# Patient Record
Sex: Male | Born: 1950
Health system: Southern US, Community
[De-identification: ages and names within clinical notes are randomized; demographics above are authoritative.]

## PROBLEM LIST (undated history)

## (undated) DIAGNOSIS — I10 Essential (primary) hypertension: Secondary | ICD-10-CM

## (undated) DIAGNOSIS — H269 Unspecified cataract: Secondary | ICD-10-CM

## (undated) DIAGNOSIS — T7840XA Allergy, unspecified, initial encounter: Secondary | ICD-10-CM

## (undated) HISTORY — DX: Allergy, unspecified, initial encounter: T78.40XA

## (undated) HISTORY — DX: Unspecified cataract: H26.9

## (undated) HISTORY — DX: Essential (primary) hypertension: I10

---

## 2003-04-02 ENCOUNTER — Encounter: Admission: RE | Admit: 2003-04-02 | Discharge: 2003-04-02 | Payer: Self-pay | Admitting: Family Medicine

## 2003-05-07 ENCOUNTER — Encounter: Admission: RE | Admit: 2003-05-07 | Discharge: 2003-05-07 | Payer: Self-pay | Admitting: Family Medicine

## 2018-12-01 ENCOUNTER — Other Ambulatory Visit: Payer: Self-pay

## 2018-12-01 ENCOUNTER — Telehealth (INDEPENDENT_AMBULATORY_CARE_PROVIDER_SITE_OTHER): Payer: 59 | Admitting: Emergency Medicine

## 2018-12-01 ENCOUNTER — Encounter: Payer: Self-pay | Admitting: Emergency Medicine

## 2018-12-01 DIAGNOSIS — I10 Essential (primary) hypertension: Secondary | ICD-10-CM

## 2018-12-01 DIAGNOSIS — T464X5A Adverse effect of angiotensin-converting-enzyme inhibitors, initial encounter: Secondary | ICD-10-CM

## 2018-12-01 DIAGNOSIS — R058 Other specified cough: Secondary | ICD-10-CM | POA: Insufficient documentation

## 2018-12-01 DIAGNOSIS — R05 Cough: Secondary | ICD-10-CM | POA: Insufficient documentation

## 2018-12-01 MED ORDER — AMLODIPINE BESYLATE 5 MG PO TABS: 5.0000 mg | ORAL_TABLET | Freq: Every day | ORAL | 3 refills | Status: DC

## 2018-12-01 NOTE — Progress Notes (Signed)
Telemedicine Encounter- SOAP NOTE Established Patient  This telephone encounter was conducted with the patient's (or proxy's) verbal consent via audio telecommunications: yes/no: Yes Patient was instructed to have this encounter in a suitably private space; and to only have persons present to whom they give permission to participate. In addition, patient identity was confirmed by use of name plus two identifiers (DOB and address).  I discussed the limitations, risks, security and privacy concerns of performing an evaluation and management service by telephone and the availability of in person appointments. I also discussed with the patient that there may be a patient responsible charge related to this service. The patient expressed understanding and agreed to proceed.  I spent a total of TIME; 0 MIN TO 60 MIN: 20 minutes talking with the patient or their proxy.  No chief complaint on file. Establish care  Subjective   Bryan Burnett is a 68 y.o. male patient. Telephone visit today to establish care.  First visit to this office. Mr. Bryan Burnett has a history of hypertension and was started on lisinopril 20 mg about a month ago.  Since he has developed a dry cough with intermittent headaches.  No other significant symptoms.  No other chronic medical problems. Non-smoker.  No history of COPD.  No significant family history.  Denies flulike symptoms, fever, chills, difficulty breathing, myalgias, diarrhea, congestion, or chest pain.  HPI   Patient Active Problem List   Diagnosis Date Noted  . Essential hypertension 12/01/2018  . Cough due to ACE inhibitor 12/01/2018    History reviewed. No pertinent past medical history.  Current Outpatient Medications  Medication Sig Dispense Refill  . amLODipine (NORVASC) 5 MG tablet Take 1 tablet (5 mg total) by mouth daily. 90 tablet 3   No current facility-administered medications for this visit.     No Known Allergies  Social History    Socioeconomic History  . Marital status: Married    Spouse name: Not on file  . Number of children: Not on file  . Years of education: Not on file  . Highest education level: Not on file  Occupational History  . Not on file  Social Needs  . Financial resource strain: Not on file  . Food insecurity:    Worry: Not on file    Inability: Not on file  . Transportation needs:    Medical: Not on file    Non-medical: Not on file  Tobacco Use  . Smoking status: Not on file  Substance and Sexual Activity  . Alcohol use: Not on file  . Drug use: Not on file  . Sexual activity: Not on file  Lifestyle  . Physical activity:    Days per week: Not on file    Minutes per session: Not on file  . Stress: Not on file  Relationships  . Social connections:    Talks on phone: Not on file    Gets together: Not on file    Attends religious service: Not on file    Active member of club or organization: Not on file    Attends meetings of clubs or organizations: Not on file    Relationship status: Not on file  . Intimate partner violence:    Fear of current or ex partner: Not on file    Emotionally abused: Not on file    Physically abused: Not on file    Forced sexual activity: Not on file  Other Topics Concern  . Not on file  Social  History Narrative  . Not on file    Review of Systems  Constitutional: Negative.  Negative for chills, fever and weight loss.  HENT: Negative.  Negative for hearing loss, nosebleeds and sore throat.   Eyes: Negative.  Negative for blurred vision and double vision.  Respiratory: Negative.  Negative for cough and shortness of breath.   Cardiovascular: Negative.  Negative for chest pain, palpitations and leg swelling.  Gastrointestinal: Negative for abdominal pain, diarrhea, nausea and vomiting.  Genitourinary: Negative for dysuria and hematuria.  Musculoskeletal: Negative for back pain, joint pain and myalgias.  Skin: Negative.  Negative for rash.   Neurological: Negative.  Negative for dizziness, sensory change, speech change, focal weakness, loss of consciousness and headaches.  Endo/Heme/Allergies: Negative.   All other systems reviewed and are negative.   Objective   Vitals as reported by the patient: None available Awake and oriented x3 in no apparent respiratory distress. There were no vitals filed for this visit.  Diagnoses and all orders for this visit:  Essential hypertension  Cough due to ACE inhibitor  Other orders -     amLODipine (NORVASC) 5 MG tablet; Take 1 tablet (5 mg total) by mouth daily.  Cough most likely secondary to ACE inhibitor.  Will stop lisinopril and start amlodipine 5 mg a day. Treatment of hypertension discussed with patient as well as cardiovascular risk associated with it. Diet and nutrition discussed with patient. Need for office visit and blood work.  We will schedule for next week.   I discussed the assessment and treatment plan with the patient. The patient was provided an opportunity to ask questions and all were answered. The patient agreed with the plan and demonstrated an understanding of the instructions.   The patient was advised to call back or seek an in-person evaluation if the symptoms worsen or if the condition fails to improve as anticipated.  I provided 20 minutes of non-face-to-face time during this encounter.  Georgina QuintMiguel Jose Brenn Deziel, MD  [facility]

## 2018-12-01 NOTE — Progress Notes (Signed)
Contacted patient to triage for appointment using Interpreter [id number]). Patient will establish care and complaining of his blood pressure went up 1 month ago. Patient states he developed a headache and cough he is concerned. Patient states he went to the doctor and he gave him Lisinopril 20 mg to take. Patient states he took his blood pressure yesterday it was 130/82.

## 2018-12-08 ENCOUNTER — Ambulatory Visit: Payer: 59 | Admitting: Emergency Medicine

## 2018-12-08 ENCOUNTER — Encounter: Payer: Self-pay | Admitting: Emergency Medicine

## 2018-12-08 ENCOUNTER — Other Ambulatory Visit: Payer: Self-pay

## 2018-12-08 VITALS — BP 128/80 | HR 97 | Temp 99.1°F | Resp 18 | Ht 64.96 in | Wt 143.4 lb

## 2018-12-08 DIAGNOSIS — Z125 Encounter for screening for malignant neoplasm of prostate: Secondary | ICD-10-CM | POA: Diagnosis not present

## 2018-12-08 DIAGNOSIS — Z1329 Encounter for screening for other suspected endocrine disorder: Secondary | ICD-10-CM | POA: Diagnosis not present

## 2018-12-08 DIAGNOSIS — I10 Essential (primary) hypertension: Secondary | ICD-10-CM

## 2018-12-08 DIAGNOSIS — Z13220 Encounter for screening for lipoid disorders: Secondary | ICD-10-CM

## 2018-12-08 DIAGNOSIS — Z130 Encounter for screening for diseases of the blood and blood-forming organs and certain disorders involving the immune mechanism: Secondary | ICD-10-CM | POA: Diagnosis not present

## 2018-12-08 DIAGNOSIS — Z23 Encounter for immunization: Secondary | ICD-10-CM | POA: Diagnosis not present

## 2018-12-08 DIAGNOSIS — Z1211 Encounter for screening for malignant neoplasm of colon: Secondary | ICD-10-CM

## 2018-12-08 DIAGNOSIS — Z13228 Encounter for screening for other metabolic disorders: Secondary | ICD-10-CM

## 2018-12-08 DIAGNOSIS — Z1321 Encounter for screening for nutritional disorder: Secondary | ICD-10-CM | POA: Diagnosis not present

## 2018-12-08 NOTE — Progress Notes (Signed)
Bryan Burnett 67 y.o.   Chief Complaint  Patient presents with  . Hypertension    f/u    HISTORY OF PRESENT ILLNESS:  This is a 68 y.o. male here to establish care with me and follow-up of hypertension.  Telemedicine visit 1 week ago.  Switch from lisinopril to amlodipine due to cough side effect.  Blood pressure readings at home as follows: 113/75, 114/70, 129/82, 142/84.  Doing better. Non-smoker and no EtOH abuser.  Physically active job.  No symptoms of angina or dyspnea on exertion.  No recent blood work to review. Good nutrition.  Good weight.  Sleeps well.  Healthy lifestyle.  HPI   Prior to Admission medications   Medication Sig Start Date End Date Taking? Authorizing Provider  amLODipine (NORVASC) 5 MG tablet Take 1 tablet (5 mg total) by mouth daily. 12/01/18  Yes SagardiaEilleen Kempf, MD    No Known Allergies  Patient Active Problem List   Diagnosis Date Noted  . Essential hypertension 12/01/2018  . Cough due to ACE inhibitor 12/01/2018    History reviewed. No pertinent past medical history.  History reviewed. No pertinent surgical history.  Social History   Socioeconomic History  . Marital status: Legally Separated    Spouse name: Not on file  . Number of children: 7  . Years of education: Not on file  . Highest education level: Not on file  Occupational History  . Not on file  Social Needs  . Financial resource strain: Not on file  . Food insecurity:    Worry: Not on file    Inability: Not on file  . Transportation needs:    Medical: Not on file    Non-medical: Not on file  Tobacco Use  . Smoking status: Never Smoker  . Smokeless tobacco: Never Used  Substance and Sexual Activity  . Alcohol use: Never    Frequency: Never  . Drug use: Never  . Sexual activity: Not Currently  Lifestyle  . Physical activity:    Days per week: Not on file    Minutes per session: Not on file  . Stress: Not on file  Relationships  . Social connections:     Talks on phone: Not on file    Gets together: Not on file    Attends religious service: Not on file    Active member of club or organization: Not on file    Attends meetings of clubs or organizations: Not on file    Relationship status: Not on file  . Intimate partner violence:    Fear of current or ex partner: Not on file    Emotionally abused: Not on file    Physically abused: Not on file    Forced sexual activity: Not on file  Other Topics Concern  . Not on file  Social History Narrative  . Not on file    History reviewed. No pertinent family history.   Review of Systems  Constitutional: Negative.  Negative for chills and fever.  HENT: Negative.  Negative for congestion, nosebleeds and sore throat.   Eyes: Negative.   Respiratory: Negative.  Negative for cough and shortness of breath.   Cardiovascular: Negative.  Negative for chest pain and palpitations.  Gastrointestinal: Negative.  Negative for abdominal pain, diarrhea, nausea and vomiting.  Genitourinary: Negative.  Negative for dysuria.  Musculoskeletal: Negative.  Negative for myalgias.  Skin: Negative.  Negative for rash.  Neurological: Positive for dizziness and headaches.  All other systems reviewed and are  negative.   Vitals:   12/08/18 1003  BP: 128/80  Pulse: 97  Resp: 18  Temp: 99.1 F (37.3 C)  SpO2: 97%    Physical Exam Vitals signs reviewed.  Constitutional:      Appearance: Normal appearance.  HENT:     Head: Normocephalic and atraumatic.     Nose: Nose normal.     Mouth/Throat:     Mouth: Mucous membranes are moist.     Pharynx: Oropharynx is clear.  Eyes:     Extraocular Movements: Extraocular movements intact.     Conjunctiva/sclera: Conjunctivae normal.     Pupils: Pupils are equal, round, and reactive to light.  Neck:     Musculoskeletal: Normal range of motion and neck supple.  Cardiovascular:     Rate and Rhythm: Normal rate and regular rhythm.     Pulses: Normal pulses.      Heart sounds: Normal heart sounds.  Pulmonary:     Effort: Pulmonary effort is normal.     Breath sounds: Normal breath sounds.  Abdominal:     General: There is no distension.     Palpations: Abdomen is soft. There is no mass.     Tenderness: There is no abdominal tenderness.  Musculoskeletal: Normal range of motion.  Skin:    General: Skin is warm and dry.     Capillary Refill: Capillary refill takes less than 2 seconds.  Neurological:     General: No focal deficit present.     Mental Status: He is alert and oriented to person, place, and time.     Sensory: No sensory deficit.     Motor: No weakness.     Coordination: Coordination normal.     Gait: Gait normal.  Psychiatric:        Mood and Affect: Mood normal.        Behavior: Behavior normal.    A total of 25 minutes was spent in the room with the patient, greater than 50% of which was in counseling/coordination of care regarding hypertension and associated cardiovascular risks, prognosis, diet and nutrition in particular DASHdiet, importance of exercise, need for blood work, medication and side effects, COVID-19 precautions, importance of vaccinations in particular to prevent pneumonia, importance of colonoscopy to screen for colon cancer, and need for follow-up in 3 months.   ASSESSMENT & PLAN: Bryan Burnett was seen today for hypertension.  Diagnoses and all orders for this visit:  Essential hypertension -     CBC with Differential/Platelet -     Comprehensive metabolic panel -     Lipid panel -     Hemoglobin A1c  Prostate cancer screening -     PSA  Screening for endocrine, nutritional, metabolic and immunity disorder -     CBC with Differential/Platelet -     Comprehensive metabolic panel -     Hemoglobin A1c  Screening for deficiency anemia -     CBC with Differential/Platelet  Screening for lipoid disorders -     Lipid panel  Colon cancer screening -     Ambulatory referral to Gastroenterology  Need for  Tdap vaccination -     Tdap vaccine greater than or equal to 7yo IM  Need for pneumococcal vaccination -     Pneumococcal polysaccharide vaccine 23-valent greater than or equal to 2yo subcutaneous/IM    Patient Instructions       If you have lab work done today you will be contacted with your lab results within the next 2  weeks.  If you have not heard from [place] then please contact us. The fastest way to get your results is to register for My Chart.   IF you received an x-ray today, you will receive an invoice from [facility]. Please contact [facility] at [phone] with questions or concerns regarding your invoice.   IF you received labwork today, you will receive an invoice from [facility]. Please contact [facility] at [phone] with questions or concerns regarding your invoice.   Our billing staff will not be able to assist you with questions regarding bills from these companies.  You will be contacted with the lab results as soon as they are available. The fastest way to get your results is to activate your My Chart account. Instructions are located on the last page of this paperwork. If you have not heard from [place] regarding the results in 2 weeks, please contact this office.     Mantenimiento de la salud despus de los 65 aos de edad Health Maintenance After Age 30 Despus de los 65 aos de edad, corre un riesgo mayor de Film/video editor enfermedades e infecciones a Air cabin crew, como tambin de sufrir lesiones por cadas. Las cadas son la causa principal de las fracturas de huesos y lesiones en la cabeza de personas mayores de 65 aos de edad. Recibir cuidados preventivos de forma regular puede ayudarlo a mantenerse saludable y en buen [place]. Los cuidados preventivos incluyen realizarse anlisis de forma regular y Forensic psychologist en el estilo de vida segn las recomendaciones del mdico. Converse con el profesional que lo asiste sobre:  Las pruebas de deteccin  y los anlisis que debe International aid/development worker. Una prueba de deteccin es un estudio que se para Engineer, manufacturing la presencia de una enfermedad cuando no tiene sntomas.  Un plan de dieta y ejercicios adecuado para usted. Qu debo saber sobre las pruebas de deteccin y los anlisis para prevenir cadas? Realizarse pruebas de deteccin y [employer] es la mejor manera de Engineer, manufacturing un problema de salud de forma temprana. El diagnstico y tratamiento tempranos le brindan la mejor oportunidad de Chief Operating Officer las afecciones mdicas que son comunes despus de los 65 aos de edad. Ciertas afecciones y elecciones de estilo de vida pueden hacer que sea ms propenso a sufrir Engineer, site. El mdico puede recomendarle lo siguiente:  Controles regulares de la visin. Una visin deficiente y afecciones como las cataratas pueden hacer que sea ms propenso a sufrir Engineer, site. Si [place] lentes, asegrese de obtener una receta actualizada si su visin cambia.  Revisin de medicamentos. Revise regularmente con el mdico todos los medicamentos que toma, incluidos los medicamentos de [place]. Consulte al [employer] Products efectos secundarios que pueden hacer que sea ms propenso a sufrir Engineer, site. Informe al mdico si alguno de los medicamentos que toma lo hace sentir mareado o somnoliento.  Pruebas de deteccin para la osteoporosis. La osteoporosis es una afeccin que hace que los huesos se vuelvan ms frgiles. En consecuencia, los huesos pueden debilitarse y quebrarse ms fcilmente.  Pruebas de deteccin para la presin arterial. Los cambios en la presin arterial y los medicamentos para Chief Operating Officer la presin arterial pueden hacerlo sentir mareado.  Controles de fuerza y equilibrio. El mdico puede recomendar ciertos estudios para controlar su fuerza y equilibrio al estar de pie, al caminar o al cambiar de posicin.  Examen de los pies. El dolor y Materials engineer en los pies, como tambin no utilizar el calzado [place], pueden hacer que  sea ms propenso  a sufrir Engineer, site.  Prueba de deteccin de la depresin. Es ms probable que sufra una cada si tiene miedo a caerse, se siente mal emocionalmente o se siente incapaz de Probation officer.  Prueba de deteccin de consumo de alcohol. Beber demasiado alcohol puede afectar su equilibrio y puede hacer que sea ms propenso a sufrir Engineer, site. Qu medidas puedo tomar para reducir mi riesgo de sufrir una cada? Instrucciones generales  Hable con el mdico sobre sus riesgos de sufrir una cada. Infrmele a su mdico si: ? Se cae. Asegrese de informarle a su mdico acerca de todas las cadas, incluso aquellas que parecen ser [employer]. ? Se siente mareado, somnoliento o que pierde el equilibrio.  Tome los medicamentos de venta libre y los recetados solamente como se lo haya indicado el mdico. Estos incluyen todos los suplementos.  Siga una dieta sana y [place] un peso saludable. Una dieta saludable incluye productos lcteos descremados, carnes bajas en contenido de grasa ([place], [place] de granos enteros, frijoles y [place] frutas y verduras. La seguridad en el hogar  Retire los objetos que puedan causar tropiezos tales como alfombras, cables u obstculos.  Instale equipos de seguridad, como barras para sostn en los baos y barandas de seguridad en las escaleras.  Mantenga las habitaciones y los pasillos bien iluminados. Actividad   Siga un programa de ejercicio regular para mantenerse en forma. Esto lo ayudar a Radio producer equilibrio. Consulte al mdico qu tipos de ejercicios son adecuados para usted.  Si necesita un bastn o un andador, selo segn las recomendaciones del mdico.  Utilice calzado con buen apoyo y suela antideslizante. Estilo de vida  No beba alcohol si el mdico le indica que no beba.  Si bebe alcohol, limite la cantidad que consume: ? De 0 a 1 medida por da para las mujeres. ? De 0 a 2 medidas por da para los hombres.  Est  atento a la cantidad de alcohol que contiene su bebida. En los [place] una medida equivale a una botella tpica de cerveza (12 onzas), media copa de vino (5 onzas) o una medida de bebida blanca (1 onza).  No consuma ningn producto que contenga nicotina o tabaco, como cigarrillos y Administrator, Civil Service. Si necesita ayuda para dejar de fumar, consulte al mdico. Resumen  Tener un estilo de vida saludable y recibir cuidados preventivos pueden ayudar a Research scientist (physical sciences) salud y el bienestar despus de los 65 aos de [place].  Realizarse pruebas de deteccin y [employer] es la mejor manera de Engineer, manufacturing un problema de salud de forma temprana y [place] a Automotive engineer una cada. El diagnstico y tratamiento tempranos le brindan la mejor oportunidad de Chief Operating Officer las afecciones mdicas ms comunes en las personas mayores de 65 aos de edad.  Las cadas son la causa principal de las fracturas de huesos y lesiones en la cabeza de personas mayores de 65 aos de edad. Tome precauciones para evitar una cada en su casa.  Trabaje con el mdico para saber qu cambios que puede hacer para mejorar su salud y [place], y para prevenir las cadas. Esta informacin no tiene Theme park manager el consejo del mdico. Asegrese de hacerle al mdico cualquier pregunta que tenga. Document Released: 08/19/2017 Document Revised: 08/19/2017 Document Reviewed: 08/19/2017 Elsevier Interactive Patient Education  2019 Elsevier Inc.  Hipertensin Hypertension Introduccin La hipertensin, conocida comnmente como presin arterial alta, se produce cuando la sangre bombea en las arterias con mucha fuerza. Las arterias son los vasos sanguneos que  transportan la sangre desde el corazn al resto del cuerpo. La hipertensin hace que el corazn haga ms esfuerzo para Insurance account manager y [employer] Corporation que las arterias se Armed forces training and education officer o Multimedia programmer. La hipertensin no tratada o no controlada puede causar infarto de miocardio, accidentes  cerebrovasculares, enfermedad renal y otros problemas. Una lectura de la presin arterial consiste de un nmero ms alto sobre un nmero ms bajo. En condiciones ideales, la presin arterial debe estar por debajo de 120/80. El primer nmero ("superior") es la presin sistlica. Es la medida de la presin de las arterias cuando el corazn late. El segundo nmero ("inferior") es la presin diastlica. Es la medida de la presin en las arterias cuando el corazn se relaja. Cules son las causas? Se desconoce la causa de esta afeccin. Qu incrementa el riesgo? Algunos factores de riesgo de hipertensin estn bajo su control. Otros no. Factores que puede Om[employer]om.  Tener diabetes mellitus tipo 2, colesterol alto, o ambos.  No hacer la cantidad suficiente de actividad fsica o ejercicio.  Tener sobrepeso.  Consumir mucha grasa, azcar, caloras o sal (sodio) en su dieta.  Beber alcohol en exceso. Factores que son difciles o imposibles de modificar  Tener enfermedad renal crnica.  Tener antecedentes familiares de presin arterial alta.  La edad. Los riesgos aumentan con la edad.  La raza. El riesgo es mayor para las Statistician.  El sexo. Antes de los 45aos, los hombres corren ms [employer]. Despus de los 65aos, las mujeres corren ms [employer].  Tener apnea obstructiva del sueo.  El estrs. Cules son los signos o los sntomas? La presin arterial extremadamente alta (crisis hipertensiva) puede provocar:  Dolor de [place].  Ansiedad.  Falta de aire.  Hemorragia nasal.  Nuseas y vmitos.  Dolor de pecho intenso.  Una crisis de movimientos que no puede controlar (convulsiones). Cmo se diagnostica? Esta afeccin se diagnostica midiendo su presin arterial mientras se encuentra sentado, con el brazo apoyado sobre una superficie. El brazalete del tensimetro debe colocarse directamente sobre la piel de la parte  superior del brazo y al nivel de su corazn. Debe medirla al [facility] veces en el mismo brazo. Determinadas condiciones pueden causar una diferencia de presin arterial entre el brazo izquierdo y Aeronautical engineer. Ciertos factores pueden provocar que las lecturas de la presin arterial sean inferiores o superiores a lo normal (elevadas) por un perodo corto de tiempo:  Si su presin arterial es ms alta cuando se encuentra en el consultorio del mdico que cuando la mide en su hogar, se denomina "hipertensin de bata blanca". La Harley[employer]Davidson de las personas que tienen esta afeccin no deben ser [employer].  Si su presin arterial es ms alta en el hogar que cuando se encuentra en el consultorio del mdico, se denomina "hipertensin enmascarada". La Harley[employer]Davidson de las personas que tienen esta afeccin deben ser medicadas para Chief Operating Officer la presin arterial. Si tiene una lecturas de presin arterial alta durante una visita o si tiene presin arterial normal con otros factores de riesgo:  Es posible que se le pida que regrese Banker para volver a Chief Operating Officer su presin arterial.  Se le puede pedir que se controle la presin arterial en su casa durante 1 semana o ms. Si se le diagnostica hipertensin, es posible que se le realicen otros anlisis de sangre o estudios de diagnstico por imgenes para ayudar a su mdico a comprender su riesgo general de tener otras afecciones. Cmo se trata? Esta  afeccin se trata haciendo cambios saludables en el estilo de vida, tales como ingerir alimentos saludables, realizar ms ejercicio y reducir el consumo de alcohol. El mdico puede recetarle medicamentos si los cambios en el estilo de vida no son suficientes para Museum/gallery curatorlograr controlar la presin arterial y si:  Su presin arterial sistlica est por encima de 130.  Su presin arterial diastlica est por encima de 80. La presin arterial deseada puede variar en funcin de las enfermedades, la edad y otros factores personales. Siga  estas instrucciones en su casa: Comida y bebida   Siga una dieta con alto contenido de fibras y Chalfantpotasio, y con bajo contenido de sodio, International aid/development workerazcar agregada y Neurosurgeongrasas. Un ejemplo de plan alimenticio es la dieta DASH (Dietary Approaches to Stop Hypertension, Mtodos alimenticios para detener la hipertensin). Para alimentarse de esta manera: ? Coma mucha fruta y verdura fresca. Trate de que la mitad del plato de cada comida sea de frutas y verduras. ? Coma cereales integrales, como pasta integral, arroz integral y pan integral. Llene aproximadamente un cuarto del plato con cereales integrales. ? Coma y beba productos lcteos con bajo contenido de grasa, como leche descremada o yogur bajo en grasas. ? Evite la ingesta de cortes de carne grasa, carne procesada o curada, y carne de ave con piel. Llene aproximadamente un cuarto del plato con protenas magras, como pescado, pollo sin piel, frijoles, huevos y tofu. ? Evite ingerir alimentos prehechos o procesados. En general, estos tienen mayor cantidad de sodio, azcar agregada y Steffanie Rainwatergrasa.  Reduzca su ingesta diaria de sodio. La mayora de las personas que tienen hipertensin deben comer menos de 1500 mg de sodio por C.H. Robinson Worldwideda.  Limite el consumo de alcohol a no ms de 1 medida por da si es mujer y no est Orthoptistembarazada y a 2 medidas por da si es hombre. Una medida equivale a 12onzas de cerveza, 5onzas de vino o 1onzas de bebidas alcohlicas de alta graduacin. Estilo de vida  Trabaje con su mdico para mantener un peso saludable o Curatorperder peso. Pregntele cual es su peso recomendado.  Realice al menos 30 minutos de ejercicio que haga que se acelere su corazn (ejercicio Magazine features editoraerbico) la DIRECTVmayora de los das de la Penney Farmssemana. Estas actividades pueden incluir caminar, nadar o andar en bicicleta.  Incluya ejercicios para fortalecer sus msculos (ejercicios de resistencia), como pilates o levantamiento de pesas, como parte de su rutina semanal de ejercicios. Intente realizar  30minutos de este tipo de ejercicios al Kelloggmenos tres das a la [place]semana.  No consuma ningn producto que contenga nicotina o tabaco, como cigarrillos y Administrator, Civil Servicecigarrillos electrnicos. Si necesita ayuda para dejar de fumar, consulte al mdico.  Contrlese la presin arterial en su casa segn las indicaciones del mdico.  Concurra a todas las visitas de control como se lo haya indicado el mdico. Esto es importante. Medicamentos  Baxter Internationalome los medicamentos de venta libre y los recetados solamente como se lo haya indicado el mdico. Siga cuidadosamente las indicaciones. Los medicamentos para la presin arterial deben tomarse segn las indicaciones.  No omita las dosis de medicamentos para la presin arterial. Si lo hace, estar en riesgo de tener problemas y puede hacer que los medicamentos sean menos eficaces.  Pregntele a su mdico a qu efectos secundarios o reacciones a los Museum/gallery curatormedicamentos debe prestar atencin. Comunquese con un mdico si:  Piensa que tiene una reaccin a un medicamento que est tomando.  Tiene dolores de cabeza frecuentes (recurrentes).  Siente mareos.  Tiene hinchazn en los tobillos.  Tiene problemas de visin. Solicite ayuda de inmediato si:  Siente un dolor de cabeza intenso o confusin.  Siente debilidad inusual o adormecimiento.  Siente que va a desmayarse.  Siente un dolor intenso en el pecho o el abdomen.  Vomita repetidas veces.  Tiene dificultad para respirar. Resumen  La hipertensin se produce cuando la sangre bombea en las arterias con mucha fuerza. Si esta afeccin no se controla, podra correr riesgo de tener complicaciones graves.  La presin arterial deseada puede variar en funcin de las enfermedades, la edad y otros factores personales. Para la [employer], una presin arterial normal es menor que 120/80.  La hipertensin se trata con cambios en el estilo de vida, medicamentos o una combinacin de [place]. Los [employer] Corporation estilo de vida  incluyen prdida de peso, ingerir alimentos sanos, seguir una dieta baja en sodio, hacer ms ejercicio y Glass blower/designer consumo de alcohol. Esta informacin no tiene Theme park manager el consejo del mdico. Asegrese de hacerle al mdico cualquier pregunta que tenga. Document Released: 07/06/2005 Document Revised: 06/17/2016 Document Reviewed: 06/17/2016 Elsevier Interactive Patient Education  2019 Elsevier Inc.      Edwina Barth, MD [facility]

## 2018-12-08 NOTE — Patient Instructions (Addendum)
   If you have lab work done today you will be contacted with your lab results within the next 2 weeks.  If you have not heard from us then please contact us. The fastest way to get your results is to register for My Chart.   IF you received an x-ray today, you will receive an invoice from [facility]. Please contact [facility] at [phone] with questions or concerns regarding your invoice.   IF you received labwork today, you will receive an invoice from [facility]. Please contact [facility] at [phone] with questions or concerns regarding your invoice.   Our billing staff will not be able to assist you with questions regarding bills from these companies.  You will be contacted with the lab results as soon as they are available. The fastest way to get your results is to activate your My Chart account. Instructions are located on the last page of this paperwork. If you have not heard from us regarding the results in 2 weeks, please contact this office.     Mantenimiento de la salud despus de los 65 aos de edad Health Maintenance After Age 65 Despus de los 65 aos de edad, corre un riesgo mayor de padecer ciertas enfermedades e infecciones a largo plazo, como tambin de sufrir lesiones por cadas. Las cadas son la causa principal de las fracturas de huesos y lesiones en la cabeza de personas mayores de 65 aos de edad. Recibir cuidados preventivos de forma regular puede ayudarlo a mantenerse saludable y en buen estado. Los cuidados preventivos incluyen realizarse anlisis de forma regular y realizar cambios en el estilo de vida segn las recomendaciones del mdico. Converse con el profesional que lo asiste sobre:  Las pruebas de deteccin y los anlisis que debe realizarse. Una prueba de deteccin es un estudio que se para detectar la presencia de una enfermedad cuando no tiene sntomas.  Un plan de dieta y ejercicios adecuado para usted. Qu debo saber sobre las  pruebas de deteccin y los anlisis para prevenir cadas? Realizarse pruebas de deteccin y anlisis es la mejor manera de detectar un problema de salud de forma temprana. El diagnstico y tratamiento tempranos le brindan la mejor oportunidad de controlar las afecciones mdicas que son comunes despus de los 65 aos de edad. Ciertas afecciones y elecciones de estilo de vida pueden hacer que sea ms propenso a sufrir una cada. El mdico puede recomendarle lo siguiente:  Controles regulares de la visin. Una visin deficiente y afecciones como las cataratas pueden hacer que sea ms propenso a sufrir una cada. Si usa lentes, asegrese de obtener una receta actualizada si su visin cambia.  Revisin de medicamentos. Revise regularmente con el mdico todos los medicamentos que toma, incluidos los medicamentos de venta libre. Consulte al mdico sobre los efectos secundarios que pueden hacer que sea ms propenso a sufrir una cada. Informe al mdico si alguno de los medicamentos que toma lo hace sentir mareado o somnoliento.  Pruebas de deteccin para la osteoporosis. La osteoporosis es una afeccin que hace que los huesos se vuelvan ms frgiles. En consecuencia, los huesos pueden debilitarse y quebrarse ms fcilmente.  Pruebas de deteccin para la presin arterial. Los cambios en la presin arterial y los medicamentos para controlar la presin arterial pueden hacerlo sentir mareado.  Controles de fuerza y equilibrio. El mdico puede recomendar ciertos estudios para controlar su fuerza y equilibrio al estar de pie, al caminar o al cambiar de posicin.  Examen de los pies.   El dolor y Materials engineerel adormecimiento en los pies, como tambin no utilizar el calzado Interlakenadecuado, pueden hacer que sea ms propenso a sufrir Engineer, siteuna cada.  Prueba de deteccin de la depresin. Es ms probable que sufra una cada si tiene miedo a caerse, se siente mal emocionalmente o se siente incapaz de Designer, fashion/clothingrealizar actividades que sola  hacer.  Prueba de deteccin de consumo de alcohol. Beber demasiado alcohol puede afectar su equilibrio y puede hacer que sea ms propenso a sufrir Engineer, siteuna cada. Qu medidas puedo tomar para reducir mi riesgo de sufrir una cada? Instrucciones generales  Hable con el mdico sobre sus riesgos de sufrir una cada. Infrmele a su mdico si: ? Se cae. Asegrese de informarle a su mdico acerca de todas las cadas, incluso aquellas que parecen ser Liberty Globalmenores. ? Se siente mareado, somnoliento o que pierde el equilibrio.  Tome los medicamentos de venta libre y los recetados solamente como se lo haya indicado el mdico. Estos incluyen todos los suplementos.  Siga una dieta sana y Jacksonmantenga un peso saludable. Una dieta saludable incluye productos lcteos descremados, carnes bajas en contenido de grasa (Des Arcmagras, Elmirafibra de granos enteros, frijoles y [place] Aetnamuchas frutas y verduras. La seguridad en el hogar  Retire los objetos que puedan causar tropiezos tales como alfombras, cables u obstculos.  Instale equipos de seguridad, como barras para sostn en los baos y barandas de seguridad en las escaleras.  Mantenga las habitaciones y los pasillos bien iluminados. Actividad   Siga un programa de ejercicio regular para mantenerse en forma. Esto lo ayudar a Radio producermantener el equilibrio. Consulte al mdico qu tipos de ejercicios son adecuados para usted.  Si necesita un bastn o un andador, selo segn las recomendaciones del mdico.  Utilice calzado con buen apoyo y suela antideslizante. Estilo de vida  No beba alcohol si el mdico le indica que no beba.  Si bebe alcohol, limite la cantidad que consume: ? De 0 a 1 medida por da para las mujeres. ? De 0 a 2 medidas por da para los hombres.  Est atento a la cantidad de alcohol que contiene su bebida. En los [place] una medida equivale a una botella tpica de cerveza (12 onzas), media copa de vino (5 onzas) o una medida de bebida blanca (1 onza).  No consuma  ningn producto que contenga nicotina o tabaco, como cigarrillos y Administrator, Civil Servicecigarrillos electrnicos. Si necesita ayuda para dejar de fumar, consulte al mdico. Resumen  Tener un estilo de vida saludable y recibir cuidados preventivos pueden ayudar a Research scientist (physical sciences)promover la salud y el bienestar despus de los 65 aos de [place].  Realizarse pruebas de deteccin y ARAMARK Corporationanlisis es la mejor manera de Engineer, manufacturingdetectar un problema de salud de forma temprana y Eber Hongayudarlo a Automotive engineerevitar una cada. El diagnstico y tratamiento tempranos le brindan la mejor oportunidad de Chief Operating Officercontrolar las afecciones mdicas ms comunes en las personas mayores de 65 aos de edad.  Las cadas son la causa principal de las fracturas de huesos y lesiones en la cabeza de personas mayores de 65 aos de edad. Tome precauciones para evitar una cada en su casa.  Trabaje con el mdico para saber qu cambios que puede hacer para mejorar su salud y Burdettbienestar, y para prevenir las cadas. Esta informacin no tiene Theme park managercomo fin reemplazar el consejo del mdico. Asegrese de hacerle al mdico cualquier pregunta que tenga. Document Released: 08/19/2017 Document Revised: 08/19/2017 Document Reviewed: 08/19/2017 Elsevier Interactive Patient Education  2019 Elsevier Inc.  Hipertensin Hypertension Introduccin La hipertensin, conocida comnmente como  presin arterial alta, se produce cuando la sangre bombea en las arterias con mucha fuerza. Las arterias son los vasos sanguneos que transportan la sangre desde el corazn al resto del cuerpo. La hipertensin hace que el corazn haga ms esfuerzo para Insurance account manager y [employer] Corporation que las arterias se Armed forces training and education officer o Multimedia programmer. La hipertensin no tratada o no controlada puede causar infarto de miocardio, accidentes cerebrovasculares, enfermedad renal y otros problemas. Una lectura de la presin arterial consiste de un nmero ms alto sobre un nmero ms bajo. En condiciones ideales, la presin arterial debe estar por debajo de 120/80. El primer  nmero ("superior") es la presin sistlica. Es la medida de la presin de las arterias cuando el corazn late. El segundo nmero ("inferior") es la presin diastlica. Es la medida de la presin en las arterias cuando el corazn se relaja. Cules son las causas? Se desconoce la causa de esta afeccin. Qu incrementa el riesgo? Algunos factores de riesgo de hipertensin estn bajo su control. Otros no. Factores que puede Omnicom.  Tener diabetes mellitus tipo 2, colesterol alto, o ambos.  No hacer la cantidad suficiente de actividad fsica o ejercicio.  Tener sobrepeso.  Consumir mucha grasa, azcar, caloras o sal (sodio) en su dieta.  Beber alcohol en exceso. Factores que son difciles o imposibles de modificar  Tener enfermedad renal crnica.  Tener antecedentes familiares de presin arterial alta.  La edad. Los riesgos aumentan con la edad.  La raza. El riesgo es mayor para las Statistician.  El sexo. Antes de los 45aos, los hombres corren ms [employer]. Despus de los 65aos, las mujeres corren ms [employer].  Tener apnea obstructiva del sueo.  El estrs. Cules son los signos o los sntomas? La presin arterial extremadamente alta (crisis hipertensiva) puede provocar:  Dolor de [place].  Ansiedad.  Falta de aire.  Hemorragia nasal.  Nuseas y vmitos.  Dolor de pecho intenso.  Una crisis de movimientos que no puede controlar (convulsiones). Cmo se diagnostica? Esta afeccin se diagnostica midiendo su presin arterial mientras se encuentra sentado, con el brazo apoyado sobre una superficie. El brazalete del tensimetro debe colocarse directamente sobre la piel de la parte superior del brazo y al nivel de su corazn. Debe medirla al [facility] veces en el mismo brazo. Determinadas condiciones pueden causar una diferencia de presin arterial entre el brazo izquierdo y Aeronautical engineer. Ciertos factores pueden  provocar que las lecturas de la presin arterial sean inferiores o superiores a lo normal (elevadas) por un perodo corto de tiempo:  Si su presin arterial es ms alta cuando se encuentra en el consultorio del mdico que cuando la mide en su hogar, se denomina "hipertensin de bata blanca". La Harley-Davidson de las personas que tienen esta afeccin no deben ser [employer]el[employer] Corporation.  Si su presin arterial es ms alta en el hogar que cuando se encuentra en el consultorio del mdico, se denomina "hipertensin enmascarada". La Harley-Davidson de las personas que tienen esta afeccin deben ser medicadas para Chief Operating Officer la presin arterial. Si tiene una lecturas de presin arterial alta durante una visita o si tiene presin arterial normal con otros factores de riesgo:  Es posible que se le pida que regrese Banker para volver a Chief Operating Officer su presin arterial.  Se le puede pedir que se controle la presin arterial en su casa durante 1 semana o ms. Si se le diagnostica hipertensin, es posible que se le realicen otros anlisis de [place] o [place]  de diagnstico por imgenes para ayudar a su mdico a comprender su riesgo general de tener otras afecciones. Cmo se trata? Esta afeccin se trata haciendo cambios saludables en el estilo de vida, tales como ingerir alimentos saludables, realizar ms ejercicio y reducir el consumo de alcohol. El mdico puede recetarle medicamentos si los cambios en el estilo de vida no son suficientes para Museum/gallery curator la presin arterial y si:  Su presin arterial sistlica est por encima de 130.  Su presin arterial diastlica est por encima de 80. La presin arterial deseada puede variar en funcin de las enfermedades, la edad y otros factores personales. Siga estas instrucciones en su casa: Comida y bebida   Siga una dieta con alto contenido de fibras y [place], y con bajo contenido de sodio, International aid/development worker agregada y Neurosurgeon. Un ejemplo de plan alimenticio es la dieta DASH (Dietary Approaches  to Stop Hypertension, Mtodos alimenticios para detener la hipertensin). Para alimentarse de esta manera: ? Coma mucha fruta y verdura fresca. Trate de que la mitad del plato de cada comida sea de frutas y verduras. ? Coma cereales integrales, como pasta integral, arroz integral y pan integral. Llene aproximadamente un cuarto del plato con cereales integrales. ? Coma y beba productos lcteos con bajo contenido de grasa, como leche descremada o yogur bajo en grasas. ? Evite la ingesta de cortes de carne grasa, carne procesada o curada, y carne de ave con piel. Llene aproximadamente un cuarto del plato con protenas magras, como pescado, pollo sin piel, frijoles, huevos y tofu. ? Evite ingerir alimentos prehechos o procesados. En general, estos tienen mayor cantidad de sodio, azcar agregada y Steffanie Rain[place].  Reduzca su ingesta diaria de sodio. La mayora de las personas que tienen hipertensin deben comer menos de 1500 mg de sodio por [employer].  Limite el consumo de alcohol a no ms de 1 medida por da si es mujer y no est Orthoptist y a 2 medidas por da si es hombre. Una medida equivale a 12onzas de cerveza, 5onzas de vino o 1onzas de bebidas alcohlicas de alta graduacin. Estilo de vida  Trabaje con su mdico para mantener un peso saludable o Curator. Pregntele cual es su peso recomendado.  Realice al menos 30 minutos de ejercicio que haga que se acelere su corazn (ejercicio Magazine features editor) la DIRECTV de la [place]. Estas actividades pueden incluir caminar, nadar o andar en bicicleta.  Incluya ejercicios para fortalecer sus msculos (ejercicios de resistencia), como pilates o levantamiento de pesas, como parte de su rutina semanal de ejercicios. Intente realizar de este tipo de ejercicios al Kellogg a la [place].  No consuma ningn producto que contenga nicotina o tabaco, como cigarrillos y Administrator, Civil Service. Si necesita ayuda para dejar de fumar, consulte al  mdico.  Contrlese la presin arterial en su casa segn las indicaciones del mdico.  Concurra a todas las visitas de control como se lo haya indicado el mdico. Esto es importante. Medicamentos  [employer] International de venta libre y los recetados solamente como se lo haya indicado el mdico. Siga cuidadosamente las indicaciones. Los medicamentos para la presin arterial deben tomarse segn las indicaciones.  No omita las dosis de medicamentos para la presin arterial. Si lo hace, estar en riesgo de tener problemas y puede hacer que los medicamentos sean menos eficaces.  Pregntele a su mdico a qu efectos secundarios o reacciones a los Museum/gallery curator. Comunquese con un mdico si:  Piensa que tiene [place] reaccin  a un medicamento que est tomando.  Tiene dolores de cabeza frecuentes (recurrentes).  Siente mareos.  Tiene hinchazn en los tobillos.  Tiene problemas de visin. Solicite ayuda de inmediato si:  Siente un dolor de cabeza intenso o confusin.  Siente debilidad inusual o adormecimiento.  Siente que va a desmayarse.  Siente un dolor intenso en el pecho o el abdomen.  Vomita repetidas veces.  Tiene dificultad para respirar. Resumen  La hipertensin se produce cuando la sangre bombea en las arterias con mucha fuerza. Si esta afeccin no se controla, podra correr riesgo de tener complicaciones graves.  La presin arterial deseada puede variar en funcin de las enfermedades, la edad y otros factores personales. Para la [employer], una presin arterial normal es menor que 120/80.  La hipertensin se trata con cambios en el estilo de vida, medicamentos o una combinacin de [place]. Los [employer]her Corporation estilo de vida incluyen prdida de peso, ingerir alimentos sanos, seguir una dieta baja en sodio, hacer ms ejercicio y Glass blower/designer consumo de alcohol. Esta informacin no tiene Theme park manager el consejo del mdico. Asegrese de hacerle  al mdico cualquier pregunta que tenga. Document Released: 07/06/2005 Document Revised: 06/17/2016 Document Reviewed: 06/17/2016 Elsevier Interactive Patient Education  2019 [employer].

## 2018-12-09 LAB — COMPREHENSIVE METABOLIC PANEL
ALT: 16 IU/L (ref 0–44)
AST: 22 IU/L (ref 0–40)
Albumin/Globulin Ratio: 2.8 — ABNORMAL HIGH (ref 1.2–2.2)
Albumin: 4.7 g/dL (ref 3.8–4.8)
Alkaline Phosphatase: 75 IU/L (ref 39–117)
BUN/Creatinine Ratio: 14 (ref 10–24)
BUN: 12 mg/dL (ref 8–27)
Bilirubin Total: 0.6 mg/dL (ref 0.0–1.2)
CO2: 23 mmol/L (ref 20–29)
Calcium: 9.3 mg/dL (ref 8.6–10.2)
Chloride: 102 mmol/L (ref 96–106)
Creatinine, Ser: 0.86 mg/dL (ref 0.76–1.27)
GFR calc Af Amer: 104 mL/min/{1.73_m2} (ref >59)
GFR calc non Af Amer: 90 mL/min/{1.73_m2} (ref >59)
Globulin, Total: 1.7 g/dL (ref 1.5–4.5)
Glucose: 96 mg/dL (ref 65–99)
Potassium: 4 mmol/L (ref 3.5–5.2)
Sodium: 138 mmol/L (ref 134–144)
Total Protein: 6.4 g/dL (ref 6.0–8.5)

## 2018-12-09 LAB — CBC WITH DIFFERENTIAL/PLATELET
Basophils Absolute: 0 10*3/uL (ref 0.0–0.2)
Basos: 0 %
EOS (ABSOLUTE): 0.1 10*3/uL (ref 0.0–0.4)
Eos: 1 %
Hematocrit: 47 % (ref 37.5–51.0)
Hemoglobin: 15.7 g/dL (ref 13.0–17.7)
Immature Grans (Abs): 0 10*3/uL (ref 0.0–0.1)
Immature Granulocytes: 0 %
Lymphocytes Absolute: 0.8 10*3/uL (ref 0.7–3.1)
Lymphs: 17 %
MCH: 32.2 pg (ref 26.6–33.0)
MCHC: 33.4 g/dL (ref 31.5–35.7)
MCV: 97 fL (ref 79–97)
Monocytes Absolute: 0.3 10*3/uL (ref 0.1–0.9)
Monocytes: 8 %
Neutrophils Absolute: 3.3 10*3/uL (ref 1.4–7.0)
Neutrophils: 74 %
Platelets: 110 10*3/uL — ABNORMAL LOW (ref 150–450)
RBC: 4.87 x10E6/uL (ref 4.14–5.80)
RDW: 13.4 % (ref 11.6–15.4)
WBC: 4.5 10*3/uL (ref 3.4–10.8)

## 2018-12-09 LAB — HEMOGLOBIN A1C
Est. average glucose Bld gHb Est-mCnc: 114 mg/dL
Hgb A1c MFr Bld: 5.6 % (ref 4.8–5.6)

## 2018-12-09 LAB — LIPID PANEL
Chol/HDL Ratio: 2.6 ratio (ref 0.0–5.0)
Cholesterol, Total: 181 mg/dL (ref 100–199)
HDL: 70 mg/dL (ref >39)
LDL Calculated: 95 mg/dL (ref 0–99)
Triglycerides: 80 mg/dL (ref 0–149)
VLDL Cholesterol Cal: 16 mg/dL (ref 5–40)

## 2018-12-09 LAB — PSA: Prostate Specific Ag, Serum: 2.1 ng/mL (ref 0.0–4.0)

## 2018-12-13 ENCOUNTER — Telehealth: Payer: Self-pay | Admitting: Emergency Medicine

## 2018-12-13 NOTE — Telephone Encounter (Signed)
Called about blood results.  Left a message. 

## 2019-03-13 ENCOUNTER — Telehealth: Payer: Self-pay | Admitting: Emergency Medicine

## 2019-03-13 ENCOUNTER — Ambulatory Visit: Payer: 59 | Admitting: Emergency Medicine

## 2019-03-13 NOTE — Telephone Encounter (Signed)
LVM for pt to call / provider wanting to change to virtual visit FR

## 2019-03-15 ENCOUNTER — Encounter: Payer: Self-pay | Admitting: Emergency Medicine

## 2019-04-05 ENCOUNTER — Other Ambulatory Visit: Payer: Self-pay

## 2019-04-05 ENCOUNTER — Encounter: Payer: Self-pay | Admitting: Emergency Medicine

## 2019-04-05 ENCOUNTER — Ambulatory Visit: Payer: 59 | Admitting: Emergency Medicine

## 2019-04-05 VITALS — BP 124/70 | HR 66 | Temp 98.1°F | Resp 16 | Ht 64.0 in | Wt 142.4 lb

## 2019-04-05 DIAGNOSIS — Z1211 Encounter for screening for malignant neoplasm of colon: Secondary | ICD-10-CM | POA: Diagnosis not present

## 2019-04-05 DIAGNOSIS — R42 Dizziness and giddiness: Secondary | ICD-10-CM | POA: Diagnosis not present

## 2019-04-05 DIAGNOSIS — Z23 Encounter for immunization: Secondary | ICD-10-CM | POA: Diagnosis not present

## 2019-04-05 DIAGNOSIS — I10 Essential (primary) hypertension: Secondary | ICD-10-CM

## 2019-04-05 NOTE — Patient Instructions (Addendum)
   If you have lab work done today you will be contacted with your lab results within the next 2 weeks.  If you have not heard from us then please contact us. The fastest way to get your results is to register for My Chart.   IF you received an x-ray today, you will receive an invoice from [facility]. Please contact [facility] at [phone] with questions or concerns regarding your invoice.   IF you received labwork today, you will receive an invoice from [facility]. Please contact [facility] at [phone] with questions or concerns regarding your invoice.   Our billing staff will not be able to assist you with questions regarding bills from these companies.  You will be contacted with the lab results as soon as they are available. The fastest way to get your results is to activate your My Chart account. Instructions are located on the last page of this paperwork. If you have not heard from us regarding the results in 2 weeks, please contact this office.     Hipertensin en los adultos Hypertension, Adult El trmino hipertensin es otra forma de denominar a la presin arterial elevada. La presin arterial elevada fuerza al corazn a trabajar ms para bombear la sangre. Esto puede causar problemas con el paso del tiempo. Una lectura de presin arterial est compuesta por 2 nmeros. Hay un nmero superior (sistlico) sobre un nmero inferior (diastlico). Lo ideal es tener la presin arterial por debajo de 120/80. Las elecciones saludables pueden ayudar a bajar la presin arterial, o tal vez necesite medicamentos para bajarla. Cules son las causas? Se desconoce la causa de esta afeccin. Algunas afecciones pueden estar relacionadas con la presin arterial alta. Qu incrementa el riesgo?  Fumar.  Tener diabetes mellitus tipo 2, colesterol alto, o ambos.  No hacer la cantidad suficiente de actividad fsica o ejercicio.  Tener sobrepeso.  Consumir mucha grasa,  azcar, caloras o sal (sodio) en su dieta.  Beber alcohol en exceso.  Tener una enfermedad renal a largo plazo (crnica).  Tener antecedentes familiares de presin arterial alta.  Edad. Los riesgos aumentan con la edad.  Raza. El riesgo es mayor para las personas afroamericanas.  Sexo. Antes de los 45aos, los hombres corren ms riesgo que las mujeres. Despus de los 65aos, las mujeres corren ms riesgo que los hombres.  Tener apnea obstructiva del sueo.  Estrs. Cules son los signos o los sntomas?  Es posible que la presin arterial alta puede no cause sntomas. La presin arterial muy alta (crisis hipertensiva) puede provocar: ? Dolor de cabeza. ? Sensaciones de preocupacin o nerviosismo (ansiedad). ? Falta de aire. ? Hemorragia nasal. ? Sensacin de malestar en el estmago (nuseas). ? Vmitos. ? Cambios en la forma de ver. ? Dolor muy intenso en el pecho. ? Convulsiones. Cmo se trata?  Esta afeccin se trata haciendo cambios saludables en el estilo de vida, por ejemplo: ? Consumir alimentos saludables. ? Hacer ms ejercicio. ? Beber menos alcohol.  El mdico puede recetarle medicamentos si los cambios en el estilo de vida no son suficientes para lograr controlar la presin arterial y si: ? El nmero de arriba est por encima de 130. ? El nmero de abajo est por encima de 80.  Su presin arterial personal ideal puede variar. Siga estas instrucciones en su casa: Comida y bebida   Si se lo dicen, siga el plan de alimentacin de DASH (Dietary Approaches to Stop Hypertension, Maneras de alimentarse para detener la hipertensin). Para   seguir este plan: ? Llene la mitad del plato de cada comida con frutas y verduras. ? Llene un cuarto del plato de cada comida con cereales integrales. Los cereales integrales incluyen pasta integral, arroz integral y pan integral. ? Coma y beba productos lcteos con bajo contenido de grasa, como leche descremada o yogur bajo en  grasas. ? Llene un cuarto del plato de cada comida con protenas bajas en grasa (magras). Las protenas bajas en grasa incluyen pescado, pollo sin piel, huevos, frijoles y tofu. ? Evite consumir carne grasa, carne curada y procesada, o pollo con piel. ? Evite consumir alimentos prehechos o procesados.  Consuma menos de 1500 mg de sal por da.  No beba alcohol si: ? El mdico le indica que no lo haga. ? Est embarazada, puede estar embarazada o est tratando de quedar embarazada.  Si bebe alcohol: ? Limite la cantidad que bebe a lo siguiente:  De 0 a 1 medida por da para las mujeres.  De 0 a 2 medidas por da para los hombres. ? Est atento a la cantidad de alcohol que hay en las bebidas que toma. En los [place], una medida equivale a una botella de cerveza de 12oz (355ml), un vaso de vino de 5oz (148ml) o un vaso de una bebida alcohlica de alta graduacin de 1oz (44ml). Estilo de vida   Trabaje con su mdico para mantenerse en un peso saludable o para perder peso. Pregntele a su mdico cul es el peso recomendable para usted.  Haga al menos 30minutos de ejercicio la mayora de los das de la semana. Estos pueden incluir caminar, nadar o andar en bicicleta.  Realice al menos 30 minutos de ejercicio que fortalezca sus msculos (ejercicios de resistencia) al menos 3 das a la semana. Estos pueden incluir levantar pesas o hacer [place].  No consuma ningn producto que contenga nicotina o tabaco, como cigarrillos, cigarrillos electrnicos y tabaco de mascar. Si necesita ayuda para dejar de fumar, consulte al mdico.  Controle su presin arterial en su casa tal como le indic el mdico.  Concurra a todas las visitas de seguimiento como se lo haya indicado el mdico. Esto es importante. Medicamentos  Tome los medicamentos de venta libre y los recetados solamente como se lo haya indicado el mdico. Siga cuidadosamente las indicaciones.  No omita las dosis de medicamentos  para la presin arterial. Los medicamentos pierden eficacia si omite dosis. El hecho de omitir las dosis tambin aumenta el riesgo de otros problemas.  Pregntele a su mdico a qu efectos secundarios o reacciones a los medicamentos debe prestar atencin. Comunquese con un mdico si:  Piensa que tiene una reaccin a los medicamentos que est tomando.  Tiene dolores de cabeza frecuentes (recurrentes).  Se siente mareado.  Tiene hinchazn en los tobillos.  Tiene problemas de visin. Solicite ayuda inmediatamente si:  Siente un dolor de cabeza muy intenso.  Empieza a sentirse desorientado (confundido).  Se siente dbil o adormecido.  Siente que va a desmayarse.  Tiene un dolor muy intenso en las siguientes zonas: ? Pecho. ? Vientre (abdomen).  Vomita ms de una vez.  Tiene dificultad para respirar. Resumen  El trmino hipertensin es otra forma de denominar a la presin arterial elevada.  La presin arterial elevada fuerza al corazn a trabajar ms para bombear la sangre.  Para la mayora de las personas, una presin arterial normal es menor que 120/80.  Las decisiones saludables pueden ayudarle a disminuir su presin arterial. Si no puede bajar   su presin arterial mediante decisiones saludables, es posible que deba tomar medicamentos. Esta informacin no tiene como fin reemplazar el consejo del mdico. Asegrese de hacerle al mdico cualquier pregunta que tenga. Document Released: 12/24/2009 Document Revised: 04/21/2018 Document Reviewed: 04/21/2018 Elsevier Patient Education  2020 Elsevier Inc.  

## 2019-04-05 NOTE — Progress Notes (Signed)
BP Readings from Last 3 Encounters:  04/05/19 124/70  12/08/18 128/80   Bryan Burnett 68 y.o.   Chief Complaint  Patient presents with  . Hypertension    FOLLOW UP 3 MONTH    HISTORY OF PRESENT ILLNESS: This is a 68 y.o. male with history of hypertension here for follow-up.  Compliant with medication.  No side effects.  Doing well. Has occasional dry cough that gets better with clearing of his throat.  Has been taking Zyrtec with some relief.  Also complaining of intermittent dizziness with no particular trigger.  Patient works outdoors, [employer].  No other significant symptoms.  HPI   Prior to Admission medications   Medication Sig Start Date End Date Taking? Authorizing Provider  amLODipine (NORVASC) 5 MG tablet Take 1 tablet (5 mg total) by mouth daily. 12/01/18  Yes SagardiaEilleen Kempf, Jillian Warth Jose, MD    No Known Allergies  Patient Active Problem List   Diagnosis Date Noted  . Essential hypertension 12/01/2018    History reviewed. No pertinent past medical history.  History reviewed. No pertinent surgical history.  Social History   Socioeconomic History  . Marital status: Legally Separated    Spouse name: Not on file  . Number of children: 7  . Years of education: Not on file  . Highest education level: Not on file  Occupational History  . Not on file  Social Needs  . Financial resource strain: Not on file  . Food insecurity    Worry: Not on file    Inability: Not on file  . Transportation needs    Medical: Not on file    Non-medical: Not on file  Tobacco Use  . Smoking status: Never Smoker  . Smokeless tobacco: Never Used  Substance and Sexual Activity  . Alcohol use: Never    Frequency: Never  . Drug use: Never  . Sexual activity: Not Currently  Lifestyle  . Physical activity    Days per week: Not on file    Minutes per session: Not on file  . Stress: Not on file  Relationships  . Social Musicianconnections    Talks on phone: Not on file    Gets  together: Not on file    Attends religious service: Not on file    Active member of club or organization: Not on file    Attends meetings of clubs or organizations: Not on file    Relationship status: Not on file  . Intimate partner violence    Fear of current or ex partner: Not on file    Emotionally abused: Not on file    Physically abused: Not on file    Forced sexual activity: Not on file  Other Topics Concern  . Not on file  Social History Narrative  . Not on file    History reviewed. No pertinent family history.   Review of Systems  Constitutional: Negative.  Negative for chills and fever.  HENT: Negative.  Negative for congestion and sore throat.   Eyes: Negative.   Respiratory: Positive for cough (Occasional nonproductive cough). Negative for shortness of breath.   Cardiovascular: Negative.  Negative for chest pain and palpitations.  Gastrointestinal: Negative.  Negative for abdominal pain, nausea and vomiting.  Genitourinary: Negative.   Skin: Negative.  Negative for rash.  Neurological: Positive for dizziness. Negative for headaches.  All other systems reviewed and are negative.  Vitals:   04/05/19 1618  BP: 124/70  Pulse: 66  Resp: 16  Temp: 98.1 F (  36.7 C)  SpO2: 98%     Physical Exam Vitals signs reviewed.  Constitutional:      Appearance: Normal appearance.  HENT:     Head: Normocephalic.  Eyes:     Extraocular Movements: Extraocular movements intact.     Conjunctiva/sclera: Conjunctivae normal.     Pupils: Pupils are equal, round, and reactive to light.  Neck:     Musculoskeletal: Normal range of motion.  Cardiovascular:     Rate and Rhythm: Normal rate and regular rhythm.     Heart sounds: Normal heart sounds.  Pulmonary:     Effort: Pulmonary effort is normal.     Breath sounds: Normal breath sounds.  Abdominal:     Palpations: Abdomen is soft.     Tenderness: There is no abdominal tenderness.  Musculoskeletal: Normal range of motion.   Skin:    General: Skin is warm and dry.     Capillary Refill: Capillary refill takes less than 2 seconds.  Neurological:     General: No focal deficit present.     Mental Status: He is alert and oriented to person, place, and time.  Psychiatric:        Mood and Affect: Mood normal.        Behavior: Behavior normal.      ASSESSMENT & PLAN: Tavoris was seen today for hypertension.  Diagnoses and all orders for this visit:  Essential hypertension  Need for prophylactic vaccination and inoculation against influenza -     Flu Vaccine QUAD High Dose(Fluad)  Colon cancer screening -     Ambulatory referral to Gastroenterology  Dizziness -     Comprehensive metabolic panel -     CBC with Differential/Platelet      Agustina Caroli, MD [facility]

## 2019-04-06 LAB — CBC WITH DIFFERENTIAL/PLATELET
Basophils Absolute: 0 10*3/uL (ref 0.0–0.2)
Basos: 1 %
EOS (ABSOLUTE): 0.2 10*3/uL (ref 0.0–0.4)
Eos: 3 %
Hematocrit: 45.2 % (ref 37.5–51.0)
Hemoglobin: 15.3 g/dL (ref 13.0–17.7)
Immature Grans (Abs): 0 10*3/uL (ref 0.0–0.1)
Immature Granulocytes: 1 %
Lymphocytes Absolute: 1.2 10*3/uL (ref 0.7–3.1)
Lymphs: 20 %
MCH: 31.9 pg (ref 26.6–33.0)
MCHC: 33.8 g/dL (ref 31.5–35.7)
MCV: 94 fL (ref 79–97)
Monocytes Absolute: 0.6 10*3/uL (ref 0.1–0.9)
Monocytes: 10 %
Neutrophils Absolute: 4.1 10*3/uL (ref 1.4–7.0)
Neutrophils: 65 %
Platelets: 121 10*3/uL — ABNORMAL LOW (ref 150–450)
RBC: 4.8 x10E6/uL (ref 4.14–5.80)
RDW: 12.2 % (ref 11.6–15.4)
WBC: 6.2 10*3/uL (ref 3.4–10.8)

## 2019-04-06 LAB — COMPREHENSIVE METABOLIC PANEL
ALT: 20 IU/L (ref 0–44)
AST: 25 IU/L (ref 0–40)
Albumin/Globulin Ratio: 2.2 (ref 1.2–2.2)
Albumin: 4.8 g/dL (ref 3.8–4.8)
Alkaline Phosphatase: 86 IU/L (ref 39–117)
BUN/Creatinine Ratio: 16 (ref 10–24)
BUN: 16 mg/dL (ref 8–27)
Bilirubin Total: 0.3 mg/dL (ref 0.0–1.2)
CO2: 26 mmol/L (ref 20–29)
Calcium: 9.6 mg/dL (ref 8.6–10.2)
Chloride: 101 mmol/L (ref 96–106)
Creatinine, Ser: 0.98 mg/dL (ref 0.76–1.27)
GFR calc Af Amer: 92 mL/min/{1.73_m2} (ref >59)
GFR calc non Af Amer: 79 mL/min/{1.73_m2} (ref >59)
Globulin, Total: 2.2 g/dL (ref 1.5–4.5)
Glucose: 92 mg/dL (ref 65–99)
Potassium: 4.5 mmol/L (ref 3.5–5.2)
Sodium: 140 mmol/L (ref 134–144)
Total Protein: 7 g/dL (ref 6.0–8.5)

## 2019-04-07 ENCOUNTER — Encounter: Payer: Self-pay | Admitting: Internal Medicine

## 2019-04-19 ENCOUNTER — Other Ambulatory Visit: Payer: Self-pay

## 2019-04-19 ENCOUNTER — Ambulatory Visit (AMBULATORY_SURGERY_CENTER): Payer: Self-pay | Admitting: *Deleted

## 2019-04-19 VITALS — Temp 97.3°F | Ht 64.0 in | Wt 148.8 lb

## 2019-04-19 DIAGNOSIS — Z1211 Encounter for screening for malignant neoplasm of colon: Secondary | ICD-10-CM

## 2019-04-19 HISTORY — PX: OTHER SURGICAL HISTORY: SHX169

## 2019-04-19 MED ORDER — NA SULFATE-K SULFATE-MG SULF 17.5-3.13-1.6 GM/177ML PO SOLN: 1.0000 | Freq: Once | ORAL | 0 refills | Status: AC

## 2019-04-19 NOTE — Progress Notes (Signed)
No egg or soy allergy known to patient  No issues with past sedation with any surgeries  or procedures, no intubation problems  No diet pills per patient No home 02 use per patient  No blood thinners per patient  Pt denies issues with constipation  No A fib or A flutter  EMMI video sent to pt's e mail   Due to the COVID-19 pandemic we are asking patients to follow these guidelines. Please only bring one care partner. Please be aware that your care partner may wait in the car in the parking lot or if they feel like they will be too hot to wait in the car, they may wait in the lobby on the 4th floor. All care partners are required to wear a mask the entire time (we do not have any that we can provide them), they need to practice social distancing, and we will do a Covid check for all patient's and care partners when you arrive. Also we will check their temperature and your temperature. If the care partner waits in their car they need to stay in the parking lot the entire time and we will call them on their cell phone when the patient is ready for discharge so they can bring the car to the front of the building. Also all patient's will need to wear a mask into building.  Pt. Refused cone interpreter,grandson interpreted for pt. Wavier signed.

## 2019-04-20 ENCOUNTER — Encounter: Payer: Self-pay | Admitting: Internal Medicine

## 2019-05-01 ENCOUNTER — Ambulatory Visit (AMBULATORY_SURGERY_CENTER): Payer: 59 | Admitting: Internal Medicine

## 2019-05-01 ENCOUNTER — Encounter: Payer: Self-pay | Admitting: Internal Medicine

## 2019-05-01 ENCOUNTER — Other Ambulatory Visit: Payer: Self-pay

## 2019-05-01 VITALS — BP 114/75 | HR 66 | Temp 98.7°F | Resp 15 | Ht 64.0 in | Wt 148.0 lb

## 2019-05-01 DIAGNOSIS — Z1211 Encounter for screening for malignant neoplasm of colon: Secondary | ICD-10-CM | POA: Diagnosis not present

## 2019-05-01 MED ORDER — SODIUM CHLORIDE 0.9 % IV SOLN: 500.0000 mL | Freq: Once | INTRAVENOUS | Status: DC

## 2019-05-01 NOTE — Patient Instructions (Signed)
YOU HAD AN ENDOSCOPIC PROCEDURE TODAY AT THE [facility]:   Refer to the procedure report that was given to you for any specific questions about what was found during the examination.  If the procedure report does not answer your questions, please call your gastroenterologist to clarify.  If you requested that your care partner not be given the details of your procedure findings, then the procedure report has been included in a sealed envelope for you to review at your convenience later.  YOU SHOULD EXPECT: Some feelings of bloating in the abdomen. Passage of more gas than usual.  Walking can help get rid of the air that was put into your GI tract during the procedure and reduce the bloating. If you had a lower endoscopy (such as a colonoscopy or flexible sigmoidoscopy) you may notice spotting of blood in your stool or on the toilet paper. If you underwent a bowel prep for your procedure, you may not have a normal bowel movement for a few days.  Please Note:  You might notice some irritation and congestion in your nose or some drainage.  This is from the oxygen used during your procedure.  There is no need for concern and it should clear up in a day or so.  SYMPTOMS TO REPORT IMMEDIATELY:   Following lower endoscopy (colonoscopy or flexible sigmoidoscopy):  Excessive amounts of blood in the stool  Significant tenderness or worsening of abdominal pains  Swelling of the abdomen that is new, acute  Fever of 100F or higher  For urgent or emergent issues, a gastroenterologist can be reached at any hour by calling [phone].   DIET:  We do recommend a small meal at first, but then you may proceed to your regular diet.  Drink plenty of fluids but you should avoid alcoholic beverages for 24 hours.  ACTIVITY:  You should plan to take it easy for the rest of today and you should NOT DRIVE or use heavy machinery until tomorrow (because of the sedation medicines used during the test).     FOLLOW UP: Our staff will call the number listed on your records 48-72 hours following your procedure to check on you and address any questions or concerns that you may have regarding the information given to you following your procedure. If we do not reach you, we will leave a message.  We will attempt to reach you two times.  During this call, we will ask if you have developed any symptoms of COVID 19. If you develop any symptoms (ie: fever, flu-like symptoms, shortness of breath, cough etc.) before then, please call [phone].  If you test positive for Covid 19 in the 2 weeks post procedure, please call and report this information to us.    If any biopsies were taken you will be contacted by phone or by letter within the next 1-3 weeks.  Please call us at [phone] if you have not heard about the biopsies in 3 weeks.    SIGNATURES/CONFIDENTIALITY: You and/or your care partner have signed paperwork which will be entered into your electronic medical record.  These signatures attest to the fact that that the information above on your After Visit Summary has been reviewed and is understood.  Full responsibility of the confidentiality of this discharge information lies with you and/or your care-partner. 

## 2019-05-01 NOTE — Progress Notes (Signed)
Temp JB V/s [place]  I have reviewed the patient's medical history in detail and updated the computerized patient record. 

## 2019-05-01 NOTE — Progress Notes (Signed)
Report to PACU, RN, vss, BBS= Clear.  

## 2019-05-01 NOTE — Op Note (Signed)
[facility] Patient Name: Bryan Burnett Procedure Date: 05/01/2019 11:10 AM MRN: [id number] Endoscopist: Wilhemina BonitoJohn N. Marina GoodellPerry , MD Age: [age] Referring MD:  Date of Birth: 09/06/1950 Gender: Male Account #: [id number] Procedure:                Colonoscopy Indications:              Screening for colorectal malignant neoplasm Medicines:                Monitored Anesthesia Care Procedure:                Pre-Anesthesia Assessment:                           - Prior to the procedure, a History and Physical                            was performed, and patient medications and                            allergies were reviewed. The patient's tolerance of                            previous anesthesia was also reviewed. The risks                            and benefits of the procedure and the sedation                            options and risks were discussed with the patient.                            All questions were answered, and informed consent                            was obtained. Prior Anticoagulants: The patient has                            taken no previous anticoagulant or antiplatelet                            agents. ASA Grade Assessment: II - A patient with                            mild systemic disease. After reviewing the risks                            and benefits, the patient was deemed in                            satisfactory condition to undergo the procedure.                           After obtaining informed consent, the colonoscope  was passed under direct vision. Throughout the                            procedure, the patient's blood pressure, pulse, and                            oxygen saturations were monitored continuously. The                            Colonoscope was introduced through the anus and                            advanced to the the cecum, identified by                            appendiceal orifice  and ileocecal valve. The                            ileocecal valve, appendiceal orifice, and rectum                            were photographed. The quality of the bowel                            preparation was excellent. The colonoscopy was                            performed without difficulty. The patient tolerated                            the procedure well. The bowel preparation used was                            SUPREP via split dose instruction. Scope In: 11:16:12 AM Scope Out: 11:25:32 AM Scope Withdrawal Time: 0 hours 7 minutes 29 seconds  Total Procedure Duration: 0 hours 9 minutes 20 seconds  Findings:                 The entire examined colon appeared normal on direct                            and retroflexion views. Complications:            No immediate complications. Estimated blood loss:                            None. Estimated Blood Loss:     Estimated blood loss: none. Impression:               - The entire examined colon is normal on direct and                            retroflexion views.                           - No specimens collected.  Recommendation:           - Repeat colonoscopy in 10 years for screening                            purposes.                           - Patient has a contact number available for                            emergencies. The signs and symptoms of potential                            delayed complications were discussed with the                            patient. Return to normal activities tomorrow.                            Written discharge instructions were provided to the                            patient.                           - Resume previous diet.                           - Continue present medications. Docia Chuck. Henrene Pastor, MD 05/01/2019 11:28:41 AM This report has been signed electronically.

## 2019-05-03 ENCOUNTER — Telehealth: Payer: Self-pay

## 2019-05-03 NOTE — Telephone Encounter (Signed)
Left message on follow up call. 

## 2019-05-03 NOTE — Telephone Encounter (Signed)
First attempt follow up call to pt, lm on vm 

## 2019-08-15 ENCOUNTER — Ambulatory Visit: Payer: 59 | Attending: Internal Medicine

## 2019-08-15 DIAGNOSIS — Z20822 Contact with and (suspected) exposure to covid-19: Secondary | ICD-10-CM

## 2019-08-16 LAB — NOVEL CORONAVIRUS, NAA: SARS-CoV-2, NAA: NOT DETECTED

## 2019-10-04 ENCOUNTER — Ambulatory Visit: Payer: 59 | Admitting: Emergency Medicine

## 2019-10-05 NOTE — Telephone Encounter (Signed)
complete

## 2019-10-16 ENCOUNTER — Ambulatory Visit: Payer: 59 | Admitting: Physician Assistant

## 2019-11-06 ENCOUNTER — Ambulatory Visit: Payer: 59 | Admitting: Physician Assistant

## 2019-11-14 ENCOUNTER — Other Ambulatory Visit: Payer: Self-pay | Admitting: General Surgery

## 2019-11-14 DIAGNOSIS — K7689 Other specified diseases of liver: Secondary | ICD-10-CM

## 2019-11-27 ENCOUNTER — Other Ambulatory Visit: Payer: Self-pay | Admitting: Emergency Medicine

## 2019-11-27 NOTE — Telephone Encounter (Signed)
No further refills without office visit 

## 2019-12-08 ENCOUNTER — Ambulatory Visit
Admission: RE | Admit: 2019-12-08 | Discharge: 2019-12-08 | Disposition: A | Discharge status: Home / Self Care | Payer: 59 | Source: Ambulatory Visit | Attending: General Surgery | Admitting: General Surgery

## 2019-12-08 ENCOUNTER — Other Ambulatory Visit: Payer: Self-pay

## 2019-12-08 DIAGNOSIS — K7689 Other specified diseases of liver: Secondary | ICD-10-CM

## 2019-12-08 MED ORDER — GADOBENATE DIMEGLUMINE 529 MG/ML IV SOLN
13.0000 mL | Freq: Once | INTRAVENOUS | Status: AC | PRN
  Administered 2019-12-08: 13 mL via INTRAVENOUS

## 2020-10-04 ENCOUNTER — Telehealth: Payer: Self-pay | Admitting: Physician Assistant

## 2020-10-04 ENCOUNTER — Encounter: Payer: Self-pay | Admitting: Physician Assistant

## 2020-10-04 NOTE — Telephone Encounter (Signed)
Received a new hem referral from Roxana Hires, [place] for thrombocytopenic disorder. Mr. Bryan Burnett has been scheduled to see Karena Addison on 4/1 at 9am. Letter mailed.

## 2020-10-17 NOTE — Progress Notes (Deleted)
[facility] [phone]   [phone]  INITIAL CONSULT NOTE  Patient Care Team: Marya Landry as PCP - General (Physician Assistant)  Hematological/Oncological History 1) Labs from PCP, Mechele Dawley PA-C -09/09/2019: WBC 5.3, Hgb 16.2, MCV 95, Plt 127 (L), Hep C Antibody negative -09/30/2019: WBC 5.6, Hgb 15.4, MCV 95, Plt 124 (L) -10/06/2019: WBC 6.0, Hgb 16.2, MCV 95, Plt 121 (L) HIV nonreactive. Hematopath shows mildly decreased platelets but normal in morphology.  -09/28/2020: WBC 4.8, Hgb 16.2, MCV 95, Plt 121 (L)  2) 10/18/2020: Establish care with Georga Kaufmann PA-C      CHIEF COMPLAINTS/PURPOSE OF CONSULTATION:  "*** "  HISTORY OF PRESENTING ILLNESS:  Bryan Burnett 70 y.o. male with medical history significant for ***  On review of the previous records ***  On exam today ***  MEDICAL HISTORY:  Past Medical History:  Diagnosis Date  . Allergy    SEASONAL-POLLEN  . Cataract    BILATERAL  . Hypertension     SURGICAL HISTORY: Past Surgical History:  Procedure Laterality Date  . NONE  04/19/2019    SOCIAL HISTORY: Social History   Socioeconomic History  . Marital status: Legally Separated    Spouse name: Not on file  . Number of children: 7  . Years of education: Not on file  . Highest education level: Not on file  Occupational History  . Not on file  Tobacco Use  . Smoking status: Former Games developer  . Smokeless tobacco: Never Used  . Tobacco comment: 15 YEARS  Vaping Use  . Vaping Use: Never used  Substance and Sexual Activity  . Alcohol use: Never  . Drug use: Never  . Sexual activity: Not Currently  Other Topics Concern  . Not on file  Social History Narrative  . Not on file   Social Determinants of Health   Financial Resource Strain: Not on file  Food Insecurity: Not on file  Transportation Needs: Not on file  Physical Activity: Not on file  Stress: Not on file  Social Connections: Not  on file  Intimate Partner Violence: Not on file    FAMILY HISTORY: Family History  Problem Relation Age of Onset  . Colon cancer Neg Hx   . Colon polyps Neg Hx   . Esophageal cancer Neg Hx   . Rectal cancer Neg Hx   . Stomach cancer Neg Hx     ALLERGIES:  has No Known Allergies.  MEDICATIONS:  Current Outpatient Medications  Medication Sig Dispense Refill  . amLODipine (NORVASC) 5 MG tablet TAKE 1 TABLET BY MOUTH EVERY DAY 30 tablet 0  . cetirizine (ZYRTEC) 10 MG tablet Take 10 mg by mouth as needed for allergies.    Marland Kitchen guaiFENesin (MUCINEX PO) Take by mouth as needed.     No current facility-administered medications for this visit.    REVIEW OF SYSTEMS:   Constitutional: ( - ) fevers, ( - )  chills , ( - ) night sweats Eyes: ( - ) blurriness of vision, ( - ) double vision, ( - ) watery eyes Ears, nose, mouth, throat, and face: ( - ) mucositis, ( - ) sore throat Respiratory: ( - ) cough, ( - ) dyspnea, ( - ) wheezes Cardiovascular: ( - ) palpitation, ( - ) chest discomfort, ( - ) lower extremity swelling Gastrointestinal:  ( - ) nausea, ( - ) heartburn, ( - ) change in bowel habits Skin: ( - ) abnormal skin rashes Lymphatics: ( - )  new lymphadenopathy, ( - ) easy bruising Neurological: ( - ) numbness, ( - ) tingling, ( - ) new weaknesses Behavioral/Psych: ( - ) mood change, ( - ) new changes  All other systems were reviewed with the patient and are negative.  PHYSICAL EXAMINATION: ECOG PERFORMANCE STATUS: {CHL ONC ECOG PS:[id number]}  There were no vitals filed for this visit. There were no vitals filed for this visit.  GENERAL: well appearing *** in NAD  SKIN: skin color, texture, turgor are normal, no rashes or significant lesions EYES: conjunctiva are pink and non-injected, sclera clear OROPHARYNX: no exudate, no erythema; lips, buccal mucosa, and tongue normal  NECK: supple, non-tender LYMPH:  no palpable lymphadenopathy in the cervical, axillary or  supraclavicular lymph nodes.  LUNGS: clear to auscultation and percussion with normal breathing effort HEART: regular rate & rhythm and no murmurs and no lower extremity edema ABDOMEN: soft, non-tender, non-distended, normal bowel sounds Musculoskeletal: no cyanosis of digits and no clubbing  PSYCH: alert & oriented x 3, fluent speech NEURO: no focal motor/sensory deficits  LABORATORY DATA:  I have reviewed the data as listed CBC Latest Ref Rng & Units 04/05/2019 12/08/2018  WBC 3.4 - 10.8 x10E3/uL 6.2 4.5  Hemoglobin 13.0 - 17.7 g/dL 62.8 31.5  Hematocrit 17.6 - 51.0 % 45.2 47.0  Platelets 150 - 450 x10E3/uL 121(L) 110(L)    CMP Latest Ref Rng & Units 04/05/2019 12/08/2018  Glucose 65 - 99 mg/dL 92 96  BUN 8 - 27 mg/dL 16 12  Creatinine 1.60 - 1.27 mg/dL 7.37 1.06  Sodium 269 - 144 mmol/L 140 138  Potassium 3.5 - 5.2 mmol/L 4.5 4.0  Chloride 96 - 106 mmol/L 101 102  CO2 20 - 29 mmol/L 26 23  Calcium 8.6 - 10.2 mg/dL 9.6 9.3  Total Protein 6.0 - 8.5 g/dL 7.0 6.4  Total Bilirubin 0.0 - 1.2 mg/dL 0.3 0.6  Alkaline Phos 39 - 117 IU/L 86 75  AST 0 - 40 IU/L 25 22  ALT 0 - 44 IU/L 20 16     PATHOLOGY: ***  BLOOD FILM: *** Review of the peripheral blood smear showed normal appearing white cells with neutrophils that were appropriately lobated and granulated. There was no predominance of bi-lobed or hyper-segmented neutrophils appreciated. No Dohle bodies were noted. There was no left shifting, immature forms or blasts noted. Lymphocytes remain normal in size without any predominance of large granular lymphocytes. Red cells show no anisopoikilocytosis, macrocytes , microcytes or polychromasia. There were no schistocytes, target cells, echinocytes, acanthocytes, dacrocytes, or stomatocytes.There was no rouleaux formation, nucleated red cells, or intra-cellular inclusions noted. The platelets are normal in size, shape, and color without any clumping evident.  RADIOGRAPHIC STUDIES: I have  personally reviewed the radiological images as listed and agreed with the findings in the report. No results found.  ASSESSMENT & PLAN ***  No orders of the defined types were placed in this encounter.   All questions were answered. The patient knows to call the clinic with any problems, questions or concerns.  A total of more than {CHL ONC TIME VISIT - SWNIO:[id number]} were spent on this encounter and over half of that time was spent on counseling and coordination of care as outlined above.    Georga Kaufmann, PA-C Department of Hematology/Oncology [facility] Phone: [phone]

## 2020-10-18 ENCOUNTER — Inpatient Hospital Stay: Payer: 59

## 2020-10-18 ENCOUNTER — Inpatient Hospital Stay: Payer: 59 | Attending: Physician Assistant | Admitting: Physician Assistant

## 2021-09-12 ENCOUNTER — Emergency Department (HOSPITAL_COMMUNITY)
Admission: EM | Admit: 2021-09-12 | Discharge: 2021-09-12 | Disposition: A | Discharge status: Home / Self Care | Payer: 59 | Attending: Emergency Medicine | Admitting: Emergency Medicine

## 2021-09-12 ENCOUNTER — Emergency Department (HOSPITAL_COMMUNITY): Payer: 59

## 2021-09-12 DIAGNOSIS — R202 Paresthesia of skin: Secondary | ICD-10-CM | POA: Diagnosis not present

## 2021-09-12 DIAGNOSIS — R079 Chest pain, unspecified: Secondary | ICD-10-CM

## 2021-09-12 DIAGNOSIS — R0789 Other chest pain: Secondary | ICD-10-CM | POA: Insufficient documentation

## 2021-09-12 DIAGNOSIS — Z20822 Contact with and (suspected) exposure to covid-19: Secondary | ICD-10-CM | POA: Diagnosis not present

## 2021-09-12 DIAGNOSIS — R059 Cough, unspecified: Secondary | ICD-10-CM | POA: Insufficient documentation

## 2021-09-12 DIAGNOSIS — Z79899 Other long term (current) drug therapy: Secondary | ICD-10-CM | POA: Insufficient documentation

## 2021-09-12 DIAGNOSIS — I10 Essential (primary) hypertension: Secondary | ICD-10-CM | POA: Insufficient documentation

## 2021-09-12 LAB — BASIC METABOLIC PANEL
Anion gap: 10 (ref 5–15)
BUN: 12 mg/dL (ref 8–23)
CO2: 23 mmol/L (ref 22–32)
Calcium: 8.9 mg/dL (ref 8.9–10.3)
Chloride: 106 mmol/L (ref 98–111)
Creatinine, Ser: 0.92 mg/dL (ref 0.61–1.24)
GFR, Estimated: >60 mL/min (ref >60)
Glucose, Bld: 122 mg/dL — ABNORMAL HIGH (ref 70–99)
Potassium: 3.8 mmol/L (ref 3.5–5.1)
Sodium: 139 mmol/L (ref 135–145)

## 2021-09-12 LAB — CBC
HCT: 49.5 % (ref 39.0–52.0)
Hemoglobin: 16.5 g/dL (ref 13.0–17.0)
MCH: 31.8 pg (ref 26.0–34.0)
MCHC: 33.3 g/dL (ref 30.0–36.0)
MCV: 95.4 fL (ref 80.0–100.0)
Platelets: 139 10*3/uL — ABNORMAL LOW (ref 150–400)
RBC: 5.19 MIL/uL (ref 4.22–5.81)
RDW: 13.1 % (ref 11.5–15.5)
WBC: 5.9 10*3/uL (ref 4.0–10.5)
nRBC: 0 % (ref 0.0–0.2)

## 2021-09-12 LAB — RESP PANEL BY RT-PCR (FLU A&B, COVID) ARPGX2
Influenza A by PCR: NEGATIVE
Influenza B by PCR: NEGATIVE
SARS Coronavirus 2 by RT PCR: NEGATIVE

## 2021-09-12 LAB — TROPONIN I (HIGH SENSITIVITY)
Troponin I (High Sensitivity): 5 ng/L (ref <18)
Troponin I (High Sensitivity): 5 ng/L (ref <18)

## 2021-09-12 LAB — D-DIMER, QUANTITATIVE: D-Dimer, Quant: 0.68 ug/mL-FEU — ABNORMAL HIGH (ref 0.00–0.50)

## 2021-09-12 MED ORDER — NITROGLYCERIN 0.4 MG SL SUBL: 0.4000 mg | SUBLINGUAL_TABLET | SUBLINGUAL | Status: DC | PRN

## 2021-09-12 MED ORDER — ASPIRIN 81 MG PO CHEW
324.0000 mg | CHEWABLE_TABLET | Freq: Once | ORAL | Status: AC
  Administered 2021-09-12: 324 mg via ORAL
  Filled 2021-09-12: qty 4

## 2021-09-12 NOTE — ED Provider Notes (Signed)
[facility] EMERGENCY DEPARTMENT Provider Note   CSN: [id number] Arrival date & time: 09/12/21  7846     History  No chief complaint on file.   Bryan Burnett is a 71 y.o. Burnett.  The history is provided by the patient and medical records. The history is limited by a language barrier. A language interpreter was used.   Bryan Burnett with significant history of hypertension who presented to the ED for evaluation of chest pain.  History obtained through language interpreter who is at bedside.  Patient report for the past 2 days he has had intermittent left-sided chest discomfort.  He described it as "pain" lasting for minutes and is waxing waning.  He also endorsed having pain and tingling sensation down his left arm and left side of face for the same duration.  Last episode of pain was earlier this morning.  Pain is minimal at this time.  No specific treatment tried.  Denies fever or chills.  Endorse occasional nonproductive cough.  Denies diaphoresis but does endorse some nausea and some mild shortness of breath.  No abdominal pain or back pain.  He has not had this kind of symptom before.  Does have history of high blood pressure in which she takes Norvasc for.  No prior cardiac work-up.  No history of stroke.  He is a former smoker.  He is right-hand dominant.  Denies increase in physical activity.  Home Medications Prior to Admission medications   Medication Sig Start Date End Date Taking? Authorizing Provider  amLODipine (NORVASC) 5 MG tablet TAKE 1 TABLET BY MOUTH EVERY DAY 11/27/19   Collie Siad A, MD  cetirizine (ZYRTEC) 10 MG tablet Take 10 mg by mouth as needed for allergies.    [provider]  guaiFENesin (MUCINEX PO) Take by mouth as needed.    [provider]      Allergies    Patient has no known allergies.    Review of Systems   Review of Systems  All other systems reviewed and are negative.  Physical  Exam Updated Vital Signs BP 131/80    Pulse 68    Temp 98.4 F (36.9 C) (Oral)    Resp 17    SpO2 99%  Physical Exam Vitals and nursing note reviewed.  Constitutional:      General: He is not in acute distress.    Appearance: He is well-developed.  HENT:     Head: Atraumatic.  Eyes:     Conjunctiva/sclera: Conjunctivae normal.  Cardiovascular:     Rate and Rhythm: Normal rate and regular rhythm.     Pulses: Normal pulses.     Heart sounds: Normal heart sounds.  Pulmonary:     Effort: Pulmonary effort is normal.     Breath sounds: Normal breath sounds.  Abdominal:     General: Abdomen is flat.     Palpations: Abdomen is soft.     Tenderness: There is no abdominal tenderness.  Musculoskeletal:     Cervical back: Neck supple.  Skin:    Capillary Refill: Capillary refill takes less than 2 seconds.     Findings: No rash.  Neurological:     Mental Status: He is alert. Mental status is at baseline.     GCS: GCS eye subscore is 4. GCS verbal subscore is 5. GCS motor subscore is 6.     Cranial Nerves: Cranial nerves 2-12 are intact.     Sensory: Sensation is intact.  Motor: Motor function is intact.  Psychiatric:        Mood and Affect: Mood normal.    ED Results / Procedures / Treatments   Labs (all labs ordered are listed, but only abnormal results are displayed) Labs Reviewed  BASIC METABOLIC PANEL - Abnormal; Notable for the following components:      Result Value   Glucose, Bld 122 (*)    All other components within normal limits  CBC - Abnormal; Notable for the following components:   Platelets 139 (*)    All other components within normal limits  D-DIMER, QUANTITATIVE - Abnormal; Notable for the following components:   D-Dimer, Quant 0.68 (*)    All other components within normal limits  RESP PANEL BY RT-PCR (FLU A&B, COVID) [id number]  TROPONIN I (HIGH SENSITIVITY)  TROPONIN I (HIGH SENSITIVITY)    EKG None  Date: 09/12/2021  Rate: 95  Rhythm: normal sinus  rhythm  QRS Axis: borderline right axis deviation  Intervals: normal  ST/T Wave abnormalities: normal  Conduction Disutrbances: none  Narrative Interpretation:   Old EKG Reviewed: No significant changes noted    Radiology DG Chest Port 1 View  Result Date: 09/12/2021 CLINICAL DATA:  Chest pain EXAM: PORTABLE CHEST 1 VIEW COMPARISON:  Portable exam 0828 hours without priors for comparison FINDINGS: Normal heart size, mediastinal contours, and pulmonary vascularity. Lungs clear. No pleural effusion or pneumothorax. Bones unremarkable. IMPRESSION: No acute abnormalities. Electronically Signed   By: Ulyses Southward M.D.   On: 09/12/2021 08:37    Procedures Procedures    Medications Ordered in ED Medications  nitroGLYCERIN (NITROSTAT) SL tablet 0.4 mg (has no administration in time range)  aspirin chewable tablet 324 mg (324 mg Oral Given 09/12/21 5284)    ED Course/ Medical Decision Making/ A&P                           Medical Decision Making Amount and/or Complexity of Data Reviewed Labs: ordered. Radiology: ordered.  Risk OTC drugs. Prescription drug management.   BP (!) 148/90    Pulse 96    Temp 98.4 F (36.9 C) (Oral)    Resp Bryan    SpO2 99%   8:18 AM This is a 71 year old Burnett with history of hypertension here with complaints of intermittent left-sided chest discomfort as well as having some tingling and pain sensation to the left side of face and left arm for the past several days.  Patient works on a golf course trimming bushes.  Denies heavy lifting.  Pain is minimal at this time.  On exam, no reproducible pain noted.  Patient without any focal neurodeficit.  Ambulate with a steady gait.  No facial droop.  Given his age and his presentation, cardiac work-up initiated.  Low suspicion for stroke   This patient presents to the ED for concern of CP, this involves an extensive number of treatment options, and is a complaint that carries with it a high risk of complications and  morbidity.  The differential diagnosis includes ACS, MSK, GERD, PNA, stroke, PE  Since pt does endorse mild SOB, and is tachycardic will obtain D-Dimer.    8:54 AM Labs imaging and EKG was independently reviewed interpreted by me.  A D-dimer was obtained and after age-adjusted it is within normal limit at 0.68.  Therefore have low suspicion for PE as the primary cause of his symptoms.  Normal WBC, normal H&H.  Chest x-ray obtained and is  unremarkable.  9:44 AM HEAR score of 5, moderate risk of MACE.  Second set of troponin is currently pending.  I have initially consulted with internal medicine resident who request for me to discuss with cardiology in regards to plan.  I did spoke with cardiologist, Dr. Carolanne Grumbling who felt that if the second set of troponin is normal patient can be follow-up outpatient for further cardiac work-up.  Care discussed with Dr. Lynelle Doctor.   12:59 PM Negative delta trop.  Pt remains symptom free.  I discussed finding with pt using Spanish interpreter.  Recommend close f/u with cardiology for outpt eval of his sxs.  Also recommend f/u with PCP as well.  Return precaution given.   Co morbidities that complicate the patient evaluation age  HTN  Additional history obtained:  Additional history obtained from wife at bedside External records from outside source obtained and reviewed including prior office visit in 04/05/19  Lab Tests:  I Ordered, and personally interpreted labs.  The pertinent results include:  negative delta trop  Imaging Studies ordered:  I ordered imaging studies including CXR I independently visualized and interpreted imaging which showed unremarkable I agree with the radiologist interpretation  Cardiac Monitoring:  The patient was maintained on a cardiac monitor.  I personally viewed and interpreted the cardiac monitored which showed an underlying rhythm of: sinus tachycardia  Medicines ordered and prescription drug management:  I ordered  medication including ASA and SL nitro  for CP Reevaluation of the patient after these medicines showed that the patient improved I have reviewed the patients home medicines and have made adjustments as needed  Test Considered: chest CTA but felt PE less likely  Critical Interventions:   Consultations Obtained:  I requested consultation with the cardiologist DR. Turner,  and discussed lab and imaging findings as well as pertinent plan - they recommend: if negative delta trop, pt can f/u outpt with cardiology for further work up  Problem List / ED Course: chest pain  Reevaluation:  After the interventions noted above, I reevaluated the patient and found that they have :improved  Social Determinants of Health: language barrier    Dispostion:  After consideration of the diagnostic results and the patients response to treatment, I feel that the patent would benefit from outpt f/u.         Final Clinical Impression(s) / ED Diagnoses Final diagnoses:  Chest pain, unspecified type    Rx / DC Orders ED Discharge Orders     None         Fayrene Helper, PA-C 09/12/21 1302    Linwood Dibbles, MD 09/13/21 1505

## 2021-09-12 NOTE — ED Triage Notes (Signed)
Pt here POV d/t CP and left arm pain. Pt reports left facial numbness X2 days. Pt ambulated to room with steady gait. Pt reports hypertension despite taking home medications.

## 2021-09-12 NOTE — Discharge Instructions (Signed)
Please call cardiologist next week for close follow up with your chest pain.  You may benefit from a cardiac stress test to make sure you are not having chest pain related to your heart.  Call and follow up closely with your doctor as well.  Return if your symptoms worsen.    Llame al cardilogo la prxima semana para un seguimiento cercano de su dolor de pecho. Puede beneficiarse de una prueba de esfuerzo cardaco para asegurarse de que no tiene Journalist, newspaper relacionado con su corazn. Llame y haga un seguimiento cercano con su mdico tambin. Regrese si sus sntomas empeoran.

## 2021-09-25 ENCOUNTER — Ambulatory Visit: Payer: 59 | Admitting: Cardiology

## 2021-10-09 ENCOUNTER — Encounter: Payer: Self-pay | Admitting: Cardiology

## 2021-10-09 DIAGNOSIS — R072 Precordial pain: Secondary | ICD-10-CM | POA: Insufficient documentation

## 2021-10-09 NOTE — Progress Notes (Signed)
?  ?Cardiology Office Note ? ? ?Date:  10/10/2021  ? ?ID:  Bryan Burnett, DOB 1950/11/02, MRN [id number] ? ?PCP:  Sandre Kitty, PA-C  ?Cardiologist:   None ?Referring:  Sandre Kitty, PA-C ? ?Chief Complaint  ?Patient presents with  ? Chest Pain  ? ? ?  ?History of Present Illness: ?Bryan Burnett is a 71 y.o. male who presents for evaluation of chest pain.  He was in the ED in Feb for this.  He was referred by Sandre Kitty, PA-CI reviewed these records for this visit.     There was no evidence for ischemia.   ? ?He said that on the day he was in the emergency room 4 weeks ago he had some left-sided discomfort.  It was mild 5 out of 10.  He has not had this before.  There was some numbness down his left arm.  This happened at rest.  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  He has not been bringing on this discomfort for this.  He denies any new shortness of breath, PND or orthopnea.  He said no palpitations, presyncope or syncope.  When he had this discomfort it lasted for probably a few hours.  He took some baby aspirin in the emergency room.  He did not have associated nausea vomiting or diaphoresis.  He has otherwise felt well.  Has not had prior cardiac history. ? ? ?Past Medical History:  ?Diagnosis Date  ? Cataract   ? BILATERAL  ? Hypertension   ? ? ?Past Surgical History:  ?Procedure Laterality Date  ? NONE  04/19/2019  ? ? ? ?Current Outpatient Medications  ?Medication Sig Dispense Refill  ? amLODipine (NORVASC) 10 MG tablet Take 10 mg by mouth daily.    ? cetirizine (ZYRTEC) 10 MG tablet Take 10 mg by mouth as needed for allergies.    ? guaiFENesin (MUCINEX PO) Take by mouth as needed.    ? ascorbic acid (VITAMIN C) 250 MG tablet Vitamin C 250 mg tablet ? Take by oral route.    ? atorvastatin (LIPITOR) 10 MG tablet Take 10 mg by mouth daily.    ? ?No current facility-administered medications for  this visit.  ? ? ?Allergies:   Patient has no known allergies.  ? ? ?Social History:  The patient  reports that he has quit smoking. He has never used smokeless tobacco. He reports that he does not drink alcohol and does not use drugs.  ? ?Family History:  The patient's family history includes Cancer in his father.  ? ? ?ROS:  Please see the history of present illness.   Otherwise, review of systems are positive for none.   All other systems are reviewed and negative.  ? ? ?PHYSICAL EXAM: ?VS:  BP 140/88   Pulse 82   Ht 5\' 4"  (1.626 m)   Wt 152 lb (68.9 kg)   SpO2 98%   BMI 26.09 kg/m?  , BMI Body mass index is 26.09 kg/m?. ?GENERAL:  Well appearing ?HEENT:  Pupils equal round and reactive, fundi not visualized, oral mucosa unremarkable ?NECK:  No jugular venous distention, waveform within normal limits, carotid upstroke brisk and symmetric, no bruits, no thyromegaly ?LYMPHATICS:  No cervical, inguinal adenopathy ?LUNGS:  Clear to auscultation bilaterally ?BACK:  No CVA tenderness ?CHEST:  Unremarkable ?HEART:  PMI not displaced or sustained,S1 and S2 within  normal limits, no S3, no S4, no clicks, no rubs, no murmurs ?ABD:  Flat, positive bowel sounds normal in frequency in pitch, no bruits, no rebound, no guarding, no midline pulsatile mass, no hepatomegaly, no splenomegaly ?EXT:  2 plus pulses throughout, no edema, no cyanosis no clubbing ?SKIN:  No rashes no nodules ?NEURO:  Cranial nerves II through XII grossly intact, motor grossly intact throughout ?PSYCH:  Cognitively intact, oriented to person place and time ? ? ? ?EKG:  EKG is ordered today. ?The ekg ordered today demonstrates sinus rhythm, rate 82, axis within normal limits, intervals within normal limits, no acute ST-T wave changes. ? ? ?Recent Labs: ?09/12/2021: BUN 12; Creatinine, Ser 0.92; Hemoglobin 16.5; Platelets 139; Potassium 3.8; Sodium 139  ? ? ?Lipid Panel ?   ?Component Value Date/Time  ? CHOL 181 12/08/2018 1041  ? TRIG 80 12/08/2018 1041   ? HDL 70 12/08/2018 1041  ? CHOLHDL 2.6 12/08/2018 1041  ? LDLCALC 95 12/08/2018 1041  ? ?  ? ?Wt Readings from Last 3 Encounters:  ?10/10/21 152 lb (68.9 kg)  ?05/01/19 148 lb (67.1 kg)  ?04/19/19 148 lb 12.8 oz (67.5 kg)  ?  ? ? ?Other studies Reviewed: ?Additional studies/ records that were reviewed today include: ED records. ?Review of the above records demonstrates:  Please see elsewhere in the note.   ? ? ?ASSESSMENT AND PLAN: ? ?PRECORDIAL CHEST PAIN: Chest discomfort has nonanginal greater than anginal features.  I think the pretest probability of obstructive coronary disease is low but I would like to screen him with a POET (Plain Old Exercise Treadmill) ? ?HTN: His blood pressure is  orderline but he says at home it is in the 120s.  He will keep a blood pressure diary. ? ?RISK REDUCTION: I will check a lipid profile when he returns. ? ? ?Current medicines are reviewed at length with the patient today.  The patient does not have concerns regarding medicines. ? ?The following changes have been made:  no change ? ?Labs/ tests ordered today include:  ? ?Orders Placed This Encounter  ?Procedures  ? Lipid panel  ? EXERCISE TOLERANCE TEST (ETT)  ? EKG 12-Lead  ? ? ? ?Disposition:   FU with as needed based on the results of the above testing ? ? ?Signed, ?Rollene Rotunda, MD  ?10/10/2021 9:28 AM    ?[facility] ? ? ? ?

## 2021-10-10 ENCOUNTER — Ambulatory Visit (INDEPENDENT_AMBULATORY_CARE_PROVIDER_SITE_OTHER): Payer: 59 | Admitting: Cardiology

## 2021-10-10 ENCOUNTER — Encounter: Payer: Self-pay | Admitting: Cardiology

## 2021-10-10 ENCOUNTER — Other Ambulatory Visit: Payer: Self-pay

## 2021-10-10 VITALS — BP 140/88 | HR 82 | Ht 64.0 in | Wt 152.0 lb

## 2021-10-10 DIAGNOSIS — R072 Precordial pain: Secondary | ICD-10-CM

## 2021-10-10 DIAGNOSIS — E785 Hyperlipidemia, unspecified: Secondary | ICD-10-CM

## 2021-10-10 NOTE — Patient Instructions (Addendum)
Medication Instructions:  ?Your physician recommends that you continue on your current medications as directed. Please refer to the Current Medication list given to you today. ? ?*If you need a refill on your cardiac medications before your next appointment, please call your pharmacy* ? ? ?Lab Work: ?Please return for FASTING labs (Lipid) ? ?Our in office lab hours are Monday-Friday 8:00-4:00, closed for lunch 12:45-1:45 pm.  No appointment needed. ? ? ?Testing/Procedures: ?Your physician has requested that you have an exercise tolerance test. For further information please visit [email or web address] Please also follow instruction sheet, as given. ? ?Follow-Up: ?At [facility], you and your health needs are our priority.  As part of our continuing mission to provide you with exceptional heart care, we have created designated Provider Care Teams.  These Care Teams include your primary Cardiologist (physician) and Advanced Practice Providers (APPs -  Physician Assistants and Nurse Practitioners) who all work together to provide you with the care you need, when you need it. ? ?We recommend signing up for the patient portal called "MyChart".  Sign up information is provided on this After Visit Summary.  MyChart is used to connect with patients for Virtual Visits (Telemedicine).  Patients are able to view lab/test results, encounter notes, upcoming appointments, etc.  Non-urgent messages can be sent to your provider as well.   ?To learn more about what you can do with MyChart, go to [email or web address].   ? ?Your next appointment:   ?AS NEEDED with Dr. Antoine Poche ? ? ?

## 2021-10-28 ENCOUNTER — Encounter (HOSPITAL_COMMUNITY): Payer: 59

## 2021-10-30 ENCOUNTER — Telehealth (HOSPITAL_COMMUNITY): Payer: Self-pay | Admitting: *Deleted

## 2021-10-30 NOTE — Telephone Encounter (Signed)
Close encounter 

## 2021-10-31 ENCOUNTER — Ambulatory Visit (HOSPITAL_COMMUNITY)
Admission: RE | Admit: 2021-10-31 | Discharge: 2021-10-31 | Disposition: A | Discharge status: Home / Self Care | Payer: 59 | Source: Ambulatory Visit | Attending: Cardiovascular Disease | Admitting: Cardiovascular Disease

## 2021-10-31 DIAGNOSIS — R072 Precordial pain: Secondary | ICD-10-CM | POA: Insufficient documentation

## 2021-10-31 LAB — EXERCISE TOLERANCE TEST
Angina Index: 0
Base ST Depression (mm): 0 mm
Duke Treadmill Score: 9
Estimated workload: 10.1
Exercise duration (min): 9 min
Exercise duration (sec): 0 s
MPHR: 151 {beats}/min
Peak HR: 142 {beats}/min
Percent HR: 94 %
Rest HR: 71 {beats}/min
ST Depression (mm): 0 mm

## 2021-11-04 ENCOUNTER — Encounter: Payer: Self-pay | Admitting: *Deleted

## 2023-02-13 IMAGING — DX DG CHEST 1V PORT
1 series · 1 of 1 positions shown · non-contrast
Comparison: Portable exam 0393 hours without priors for comparison

CLINICAL DATA: Chest pain

EXAM:
PORTABLE CHEST 1 VIEW

[chest ap]
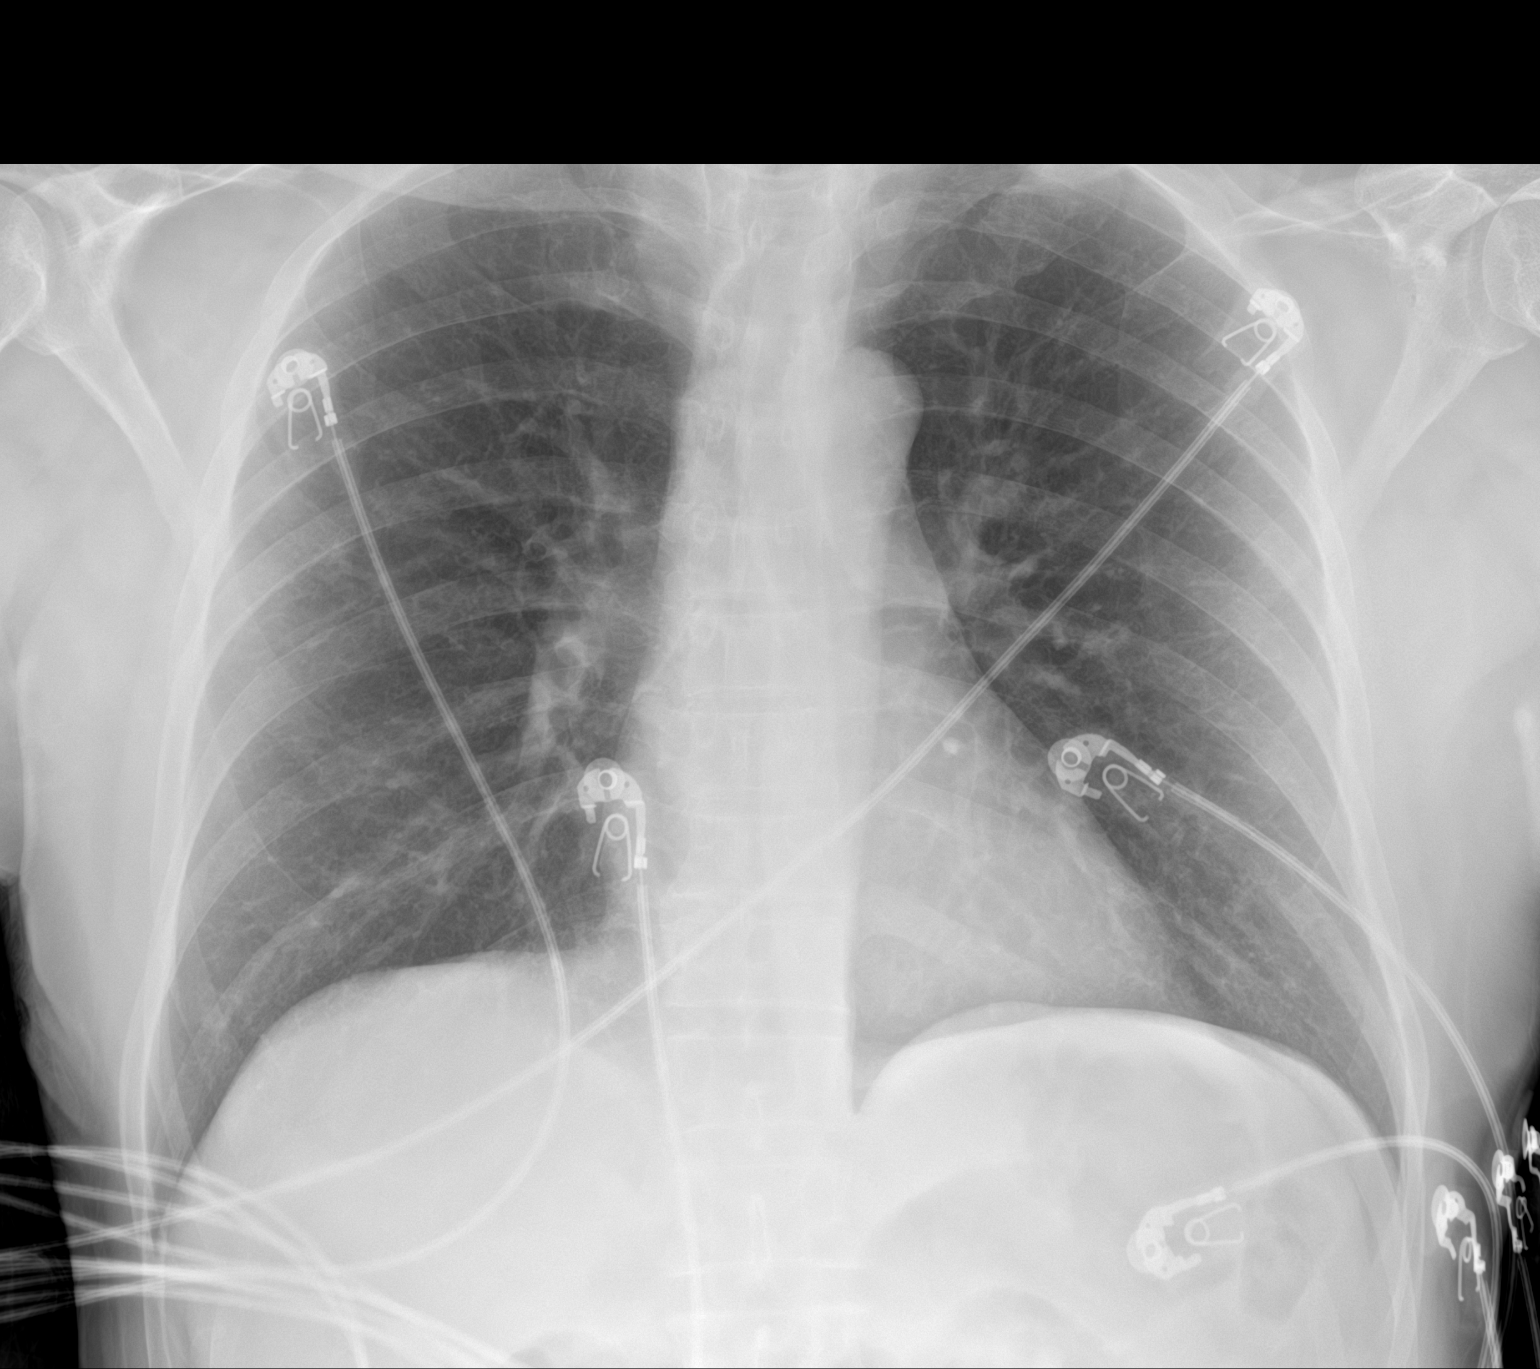

[1 of 1 positions shown; findings below may reference images not displayed]

FINDINGS: Normal heart size, mediastinal contours, and pulmonary vascularity.

Lungs clear.

No pleural effusion or pneumothorax.

Bones unremarkable.
IMPRESSION: No acute abnormalities.

## 2023-05-21 ENCOUNTER — Other Ambulatory Visit: Payer: Self-pay

## 2023-05-21 MED ORDER — TAMSULOSIN HCL 0.4 MG PO CAPS
0.4000 mg | ORAL_CAPSULE | Freq: Every day | ORAL | 2 refills | Status: DC
  Filled 2023-05-21: qty 30, 30d supply, fill #0
  Filled 2023-06-24: qty 30, 30d supply, fill #1

## 2023-05-21 MED ORDER — AMLODIPINE BESYLATE 10 MG PO TABS
10.0000 mg | ORAL_TABLET | Freq: Every day | ORAL | 1 refills | Status: DC
  Filled 2023-05-21: qty 30, 30d supply, fill #0

## 2023-05-27 ENCOUNTER — Other Ambulatory Visit: Payer: Self-pay

## 2023-05-27 MED ORDER — METFORMIN HCL ER 500 MG PO TB24
500.0000 mg | ORAL_TABLET | Freq: Every day | ORAL | 3 refills | Status: DC
  Filled 2023-05-27: qty 30, 30d supply, fill #0

## 2023-06-02 ENCOUNTER — Other Ambulatory Visit: Payer: Self-pay

## 2023-06-04 ENCOUNTER — Other Ambulatory Visit: Payer: Self-pay

## 2023-06-04 MED ORDER — METFORMIN HCL ER 750 MG PO TB24
750.0000 mg | ORAL_TABLET | Freq: Every day | ORAL | 3 refills | Status: AC
  Filled 2023-06-04: qty 30, 30d supply, fill #0

## 2023-06-04 MED ORDER — HYDROCHLOROTHIAZIDE 12.5 MG PO TABS
12.5000 mg | ORAL_TABLET | Freq: Every day | ORAL | 0 refills | Status: DC
  Filled 2023-06-04 (×2): qty 30, 30d supply, fill #0

## 2023-06-04 MED ORDER — AMLODIPINE BESYLATE 5 MG PO TABS
5.0000 mg | ORAL_TABLET | Freq: Every day | ORAL | 2 refills | Status: DC
  Filled 2023-06-04: qty 30, 30d supply, fill #0
  Filled 2023-06-24 – 2023-06-28 (×2): qty 30, 30d supply, fill #1
  Filled 2023-08-05: qty 30, 30d supply, fill #2

## 2023-06-24 ENCOUNTER — Other Ambulatory Visit: Payer: Self-pay

## 2023-06-29 ENCOUNTER — Other Ambulatory Visit: Payer: Self-pay

## 2023-06-30 ENCOUNTER — Other Ambulatory Visit: Payer: Self-pay

## 2023-06-30 MED ORDER — LATANOPROST 0.005 % OP SOLN
1.0000 [drp] | Freq: Every day | OPHTHALMIC | 3 refills | Status: DC
  Filled 2023-06-30: qty 2.5, 25d supply, fill #0
  Filled 2023-11-03: qty 2.5, 25d supply, fill #1
  Filled 2023-11-26 – 2023-12-03 (×2): qty 2.5, 25d supply, fill #2
  Filled 2024-04-17: qty 2.5, 25d supply, fill #3
  Filled 2024-06-01: qty 2.5, 25d supply, fill #4

## 2023-07-02 ENCOUNTER — Other Ambulatory Visit: Payer: Self-pay

## 2023-07-02 MED ORDER — AMOXICILLIN-POT CLAVULANATE 875-125 MG PO TABS
1.0000 | ORAL_TABLET | Freq: Two times a day (BID) | ORAL | 0 refills | Status: AC
  Filled 2023-07-02: qty 14, 7d supply, fill #0

## 2023-07-02 MED ORDER — CEPACOL SORE THROAT EX ST 15-3.6 MG MT LOZG: LOZENGE | OROMUCOSAL | 0 refills | Status: DC

## 2023-07-02 MED ORDER — HYDROCHLOROTHIAZIDE 12.5 MG PO TABS
12.5000 mg | ORAL_TABLET | Freq: Every day | ORAL | 1 refills | Status: DC
Start: 2023-07-02 — End: 2023-09-17
  Filled 2023-07-02: qty 30, 30d supply, fill #0
  Filled 2023-08-05: qty 30, 30d supply, fill #1
  Filled 2023-09-06: qty 30, 30d supply, fill #2

## 2023-07-02 MED ORDER — FLUTICASONE PROPIONATE 50 MCG/ACT NA SUSP
1.0000 | Freq: Every day | NASAL | 0 refills | Status: DC
  Filled 2023-07-02: qty 16, 30d supply, fill #0

## 2023-07-02 MED ORDER — BENZONATATE 200 MG PO CAPS
200.0000 mg | ORAL_CAPSULE | Freq: Three times a day (TID) | ORAL | 0 refills | Status: AC | PRN
  Filled 2023-07-02: qty 30, 10d supply, fill #0

## 2023-07-02 MED ORDER — AMLODIPINE BESYLATE 5 MG PO TABS
5.0000 mg | ORAL_TABLET | Freq: Every day | ORAL | 1 refills | Status: DC
  Filled 2023-07-02: qty 90, 90d supply, fill #0
  Filled 2023-09-06: qty 30, 30d supply, fill #0

## 2023-08-02 ENCOUNTER — Other Ambulatory Visit (HOSPITAL_COMMUNITY): Payer: Self-pay

## 2023-08-02 ENCOUNTER — Other Ambulatory Visit: Payer: Self-pay

## 2023-08-02 MED ORDER — CEPHALEXIN 500 MG PO CAPS
500.0000 mg | ORAL_CAPSULE | Freq: Two times a day (BID) | ORAL | 0 refills | Status: DC
  Filled 2023-08-02 – 2023-08-03 (×2): qty 20, 10d supply, fill #0

## 2023-08-02 MED ORDER — TAMSULOSIN HCL 0.4 MG PO CAPS
0.4000 mg | ORAL_CAPSULE | Freq: Every day | ORAL | 3 refills | Status: AC
  Filled 2023-08-02 – 2023-08-03 (×2): qty 30, 30d supply, fill #0
  Filled 2023-09-13: qty 30, 30d supply, fill #1
  Filled 2023-10-08: qty 30, 30d supply, fill #2

## 2023-08-03 ENCOUNTER — Other Ambulatory Visit: Payer: Self-pay

## 2023-08-03 ENCOUNTER — Other Ambulatory Visit (HOSPITAL_COMMUNITY): Payer: Self-pay

## 2023-08-05 ENCOUNTER — Other Ambulatory Visit: Payer: Self-pay

## 2023-08-06 ENCOUNTER — Other Ambulatory Visit: Payer: Self-pay

## 2023-08-06 MED ORDER — LEVOCETIRIZINE DIHYDROCHLORIDE 5 MG PO TABS
5.0000 mg | ORAL_TABLET | Freq: Every day | ORAL | 6 refills | Status: DC
  Filled 2023-08-06: qty 30, 30d supply, fill #0

## 2023-08-06 MED ORDER — HYDROCHLOROTHIAZIDE 12.5 MG PO TABS
12.5000 mg | ORAL_TABLET | Freq: Every day | ORAL | 1 refills | Status: AC
  Filled 2023-11-03: qty 30, 30d supply, fill #0
  Filled 2024-02-15: qty 30, 30d supply, fill #1
  Filled 2024-03-29: qty 30, 30d supply, fill #2

## 2023-08-06 MED ORDER — FLUTICASONE PROPIONATE 50 MCG/ACT NA SUSP
1.0000 | Freq: Every day | NASAL | 6 refills | Status: AC
  Filled 2023-08-06: qty 16, 30d supply, fill #0

## 2023-08-06 MED ORDER — AMLODIPINE BESYLATE 5 MG PO TABS
5.0000 mg | ORAL_TABLET | Freq: Every day | ORAL | 1 refills | Status: DC
  Filled 2023-08-06: qty 90, 90d supply, fill #0

## 2023-09-06 ENCOUNTER — Other Ambulatory Visit: Payer: Self-pay

## 2023-09-13 ENCOUNTER — Other Ambulatory Visit: Payer: Self-pay

## 2023-09-16 NOTE — Progress Notes (Unsigned)
 [facility] CONSULT NOTE  Patient Care Team: Marya Landry as PCP - General (Physician Assistant)  CHIEF COMPLAINTS/PURPOSE OF CONSULTATION:  Thrombocytopenia.  ASSESSMENT & PLAN:  No problem-specific Assessment & Plan notes found for this encounter.  No orders of the defined types were placed in this encounter.    HISTORY OF PRESENTING ILLNESS:  Bryan Burnett 73 y.o. male is here because of ***  REVIEW OF SYSTEMS:   Constitutional: Denies fevers, chills or abnormal night sweats Eyes: Denies blurriness of vision, double vision or watery eyes Ears, nose, mouth, throat, and face: Denies mucositis or sore throat Respiratory: Denies cough, dyspnea or wheezes Cardiovascular: Denies palpitation, chest discomfort or lower extremity swelling Gastrointestinal:  Denies nausea, heartburn or change in bowel habits Skin: Denies abnormal skin rashes Lymphatics: Denies new lymphadenopathy or easy bruising Neurological:Denies numbness, tingling or new weaknesses Behavioral/Psych: Mood is stable, no new changes  All other systems were reviewed with the patient and are negative.  MEDICAL HISTORY:  Past Medical History:  Diagnosis Date   Cataract    BILATERAL   Hypertension     SURGICAL HISTORY: Past Surgical History:  Procedure Laterality Date   NONE  04/19/2019    SOCIAL HISTORY: Social History   Socioeconomic History   Marital status: Legally Separated    Spouse name: Not on file   Number of children: 7   Years of education: Not on file   Highest education level: Not on file  Occupational History   Not on file  Tobacco Use   Smoking status: Former   Smokeless tobacco: Never   Tobacco comments:    15 YEARS  Vaping Use   Vaping status: Never Used  Substance and Sexual Activity   Alcohol use: Never   Drug use: Never   Sexual activity: Not Currently  Other Topics Concern   Not on file  Social History Narrative   Lives with daughter  and wife.  7 children.  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     Social Drivers of Corporate investment banker Strain: Not on file  Food Insecurity: Not on file  Transportation Needs: Not on file  Physical Activity: Not on file  Stress: Not on file  Social Connections: Unknown (12/01/2021)   [facility]   Social Network    Social Network: Not on file  Intimate Partner Violence: Unknown (10/23/2021)   Received from [facility]   HITS    Physically Hurt: Not on file    Insult or Talk Down To: Not on file    Threaten Physical Harm: Not on file    Scream or Curse: Not on file    FAMILY HISTORY: Family History  Problem Relation Age of Onset   Cancer Father    Colon cancer Neg Hx    Colon polyps Neg Hx    Esophageal cancer Neg Hx    Rectal cancer Neg Hx    Stomach cancer Neg Hx     ALLERGIES:  has no known allergies.  MEDICATIONS:  Current Outpatient Medications  Medication Sig Dispense Refill   amLODipine (NORVASC) 10 MG tablet Take 10 mg by mouth daily.     amLODipine (NORVASC) 5 MG tablet Take 1 tablet (5 mg total) by mouth daily. 30 tablet 2   amLODipine (NORVASC) 5 MG tablet Take 1 tablet (5 mg total) by mouth daily. 90 tablet 1   amLODipine (NORVASC) 5 MG tablet Take 1 tablet (5 mg total) by mouth daily. 90 tablet 1  ascorbic acid (VITAMIN C) 250 MG tablet Vitamin C 250 mg tablet  Take by oral route.     atorvastatin (LIPITOR) 10 MG tablet Take 10 mg by mouth daily.     Benzocaine-Menthol (CEPACOL SORE THROAT EX ST) 15-3.6 MG LOZG Take 1 lozenge every 4 hours by mucous route as needed. 64 lozenge 0   cephALEXin (KEFLEX) 500 MG capsule Take 1 capsule (500 mg total) by mouth 2 (two) times daily. 20 capsule 0   cetirizine (ZYRTEC) 10 MG tablet Take 10 mg by mouth as needed for allergies.     fluticasone (FLONASE) 50 MCG/ACT nasal spray Place 1 spray into both nostrils daily. 16 g 6   guaiFENesin (MUCINEX PO) Take by mouth as needed.     hydrochlorothiazide (HYDRODIURIL) 12.5 MG  tablet Take 1 tablet (12.5 mg total) by mouth daily. 90 tablet 1   hydrochlorothiazide (HYDRODIURIL) 12.5 MG tablet Take 1 tablet (12.5 mg total) by mouth daily. 90 tablet 1   latanoprost (XALATAN) 0.005 % ophthalmic solution Place 1 drop into both eyes at bedtime. 7.5 mL 3   levocetirizine (XYZAL ALLERGY 24HR) 5 MG tablet Take 1 tablet (5 mg total) by mouth daily. 30 tablet 6   metFORMIN (GLUCOPHAGE-XR) 750 MG 24 hr tablet Take 1 tablet (750 mg total) by mouth daily. 30 tablet 3   tamsulosin (FLOMAX) 0.4 MG CAPS capsule Take 1 capsule (0.4 mg total) by mouth at bedtime. 30 capsule 2   tamsulosin (FLOMAX) 0.4 MG CAPS capsule Take 1 capsule (0.4 mg total) by mouth daily. 90 capsule 3   No current facility-administered medications for this visit.     PHYSICAL EXAMINATION: ECOG PERFORMANCE STATUS: {CHL ONC ECOG PS:[id number]}  There were no vitals filed for this visit. There were no vitals filed for this visit.  GENERAL:alert, no distress and comfortable SKIN: skin color, texture, turgor are normal, no rashes or significant lesions EYES: normal, conjunctiva are pink and non-injected, sclera clear OROPHARYNX:no exudate, no erythema and lips, buccal mucosa, and tongue normal  NECK: supple, thyroid normal size, non-tender, without nodularity LYMPH:  no palpable lymphadenopathy in the cervical, axillary or inguinal LUNGS: clear to auscultation and percussion with normal breathing effort HEART: regular rate & rhythm and no murmurs and no lower extremity edema ABDOMEN:abdomen soft, non-tender and normal bowel sounds Musculoskeletal:no cyanosis of digits and no clubbing  PSYCH: alert & oriented x 3 with fluent speech NEURO: no focal motor/sensory deficits  LABORATORY DATA:  I have reviewed the data as listed Lab Results  Component Value Date   WBC 5.9 09/12/2021   HGB 16.5 09/12/2021   HCT 49.5 09/12/2021   MCV 95.4 09/12/2021   PLT 139 (L) 09/12/2021     Chemistry      Component  Value Date/Time   NA 139 09/12/2021 0829   NA 140 04/05/2019 1723   K 3.8 09/12/2021 0829   CL 106 09/12/2021 0829   CO2 23 09/12/2021 0829   BUN 12 09/12/2021 0829   BUN 16 04/05/2019 1723   CREATININE 0.92 09/12/2021 0829      Component Value Date/Time   CALCIUM 8.9 09/12/2021 0829   ALKPHOS 86 04/05/2019 1723   AST 25 04/05/2019 1723   ALT 20 04/05/2019 1723   BILITOT 0.3 04/05/2019 1723       RADIOGRAPHIC STUDIES: I have personally reviewed the radiological images as listed and agreed with the findings in the report. No results found.  All questions were answered. The patient knows to call  the clinic with any problems, questions or concerns. I spent *** minutes in the care of this patient including H and P, review of records, counseling and coordination of care.     Rachel Moulds, MD 09/16/2023 7:18 PM

## 2023-09-17 ENCOUNTER — Inpatient Hospital Stay: Payer: 59

## 2023-09-17 ENCOUNTER — Other Ambulatory Visit (HOSPITAL_COMMUNITY): Payer: Self-pay

## 2023-09-17 ENCOUNTER — Encounter: Payer: Self-pay | Admitting: Hematology and Oncology

## 2023-09-17 ENCOUNTER — Inpatient Hospital Stay: Payer: 59 | Attending: Hematology and Oncology | Admitting: Hematology and Oncology

## 2023-09-17 VITALS — BP 138/79 | HR 80 | Temp 98.1°F | Resp 16 | Wt 159.1 lb

## 2023-09-17 DIAGNOSIS — I10 Essential (primary) hypertension: Secondary | ICD-10-CM | POA: Insufficient documentation

## 2023-09-17 DIAGNOSIS — Z79899 Other long term (current) drug therapy: Secondary | ICD-10-CM | POA: Diagnosis not present

## 2023-09-17 DIAGNOSIS — D696 Thrombocytopenia, unspecified: Secondary | ICD-10-CM | POA: Diagnosis present

## 2023-09-17 LAB — CBC WITH DIFFERENTIAL/PLATELET
Abs Immature Granulocytes: 0.02 10*3/uL (ref 0.00–0.07)
Basophils Absolute: 0 10*3/uL (ref 0.0–0.1)
Basophils Relative: 0 %
Eosinophils Absolute: 0.3 10*3/uL (ref 0.0–0.5)
Eosinophils Relative: 5 %
HCT: 48.2 % (ref 39.0–52.0)
Hemoglobin: 16.1 g/dL (ref 13.0–17.0)
Immature Granulocytes: 0 %
Lymphocytes Relative: 19 %
Lymphs Abs: 1.1 10*3/uL (ref 0.7–4.0)
MCH: 31.8 pg (ref 26.0–34.0)
MCHC: 33.4 g/dL (ref 30.0–36.0)
MCV: 95.3 fL (ref 80.0–100.0)
Monocytes Absolute: 0.5 10*3/uL (ref 0.1–1.0)
Monocytes Relative: 8 %
Neutro Abs: 3.8 10*3/uL (ref 1.7–7.7)
Neutrophils Relative %: 68 %
Platelets: 133 10*3/uL — ABNORMAL LOW (ref 150–400)
RBC: 5.06 MIL/uL (ref 4.22–5.81)
RDW: 13.2 % (ref 11.5–15.5)
WBC: 5.7 10*3/uL (ref 4.0–10.5)
nRBC: 0 % (ref 0.0–0.2)

## 2023-09-17 LAB — CMP (CANCER CENTER ONLY)
ALT: 27 U/L (ref 0–44)
AST: 29 U/L (ref 15–41)
Albumin: 4.4 g/dL (ref 3.5–5.0)
Alkaline Phosphatase: 77 U/L (ref 38–126)
Anion gap: 4 — ABNORMAL LOW (ref 5–15)
BUN: 19 mg/dL (ref 8–23)
CO2: 31 mmol/L (ref 22–32)
Calcium: 9.1 mg/dL (ref 8.9–10.3)
Chloride: 105 mmol/L (ref 98–111)
Creatinine: 0.93 mg/dL (ref 0.61–1.24)
GFR, Estimated: >60 mL/min (ref >60)
Glucose, Bld: 78 mg/dL (ref 70–99)
Potassium: 3.9 mmol/L (ref 3.5–5.1)
Sodium: 140 mmol/L (ref 135–145)
Total Bilirubin: 0.7 mg/dL (ref 0.0–1.2)
Total Protein: 7.2 g/dL (ref 6.5–8.1)

## 2023-09-17 LAB — HEPATITIS PANEL, ACUTE
HCV Ab: NONREACTIVE
Hep A IgM: NONREACTIVE
Hep B C IgM: NONREACTIVE
Hepatitis B Surface Ag: NONREACTIVE

## 2023-09-17 LAB — TECHNOLOGIST SMEAR REVIEW

## 2023-09-17 LAB — VITAMIN B12: Vitamin B-12: 433 pg/mL (ref 180–914)

## 2023-09-17 LAB — IRON AND IRON BINDING CAPACITY (CC-WL,HP ONLY)
Iron: 165 ug/dL (ref 45–182)
Saturation Ratios: 48 % — ABNORMAL HIGH (ref 17.9–39.5)
TIBC: 347 ug/dL (ref 250–450)
UIBC: 182 ug/dL (ref 117–376)

## 2023-09-17 LAB — FERRITIN: Ferritin: 60 ng/mL (ref 24–336)

## 2023-09-17 LAB — WBC/PLT IN CITRATE

## 2023-09-17 LAB — TSH: TSH: 2.002 u[IU]/mL (ref 0.350–4.500)

## 2023-09-17 MED ORDER — SOLIFENACIN SUCCINATE 5 MG PO TABS
5.0000 mg | ORAL_TABLET | Freq: Every day | ORAL | 3 refills | Status: AC
  Filled 2023-09-17 – 2023-09-20 (×2): qty 30, 30d supply, fill #0
  Filled 2023-10-21: qty 30, 30d supply, fill #1

## 2023-09-20 ENCOUNTER — Other Ambulatory Visit (HOSPITAL_COMMUNITY): Payer: Self-pay

## 2023-09-20 ENCOUNTER — Other Ambulatory Visit: Payer: Self-pay

## 2023-09-21 LAB — FOLATE RBC
Folate, Hemolysate: 389 ng/mL
Folate, RBC: 804 ng/mL (ref >498)
Hematocrit: 48.4 % (ref 37.5–51.0)

## 2023-09-24 LAB — ANTINUCLEAR ANTIBODIES, IFA: ANA Ab, IFA: NEGATIVE

## 2023-10-08 ENCOUNTER — Other Ambulatory Visit: Payer: Self-pay

## 2023-10-13 ENCOUNTER — Inpatient Hospital Stay: Payer: 59 | Attending: Hematology and Oncology | Admitting: Hematology and Oncology

## 2023-10-13 VITALS — BP 135/69 | HR 90 | Temp 98.1°F | Resp 16 | Wt 160.9 lb

## 2023-10-13 DIAGNOSIS — Z87891 Personal history of nicotine dependence: Secondary | ICD-10-CM | POA: Insufficient documentation

## 2023-10-13 DIAGNOSIS — D696 Thrombocytopenia, unspecified: Secondary | ICD-10-CM | POA: Diagnosis not present

## 2023-10-13 DIAGNOSIS — D693 Immune thrombocytopenic purpura: Secondary | ICD-10-CM | POA: Insufficient documentation

## 2023-10-13 NOTE — Progress Notes (Signed)
 [facility] CONSULT NOTE  Patient Care Team: Marya Landry as PCP - General (Physician Assistant)  CHIEF COMPLAINTS/PURPOSE OF CONSULTATION:  Thrombocytopenia.  ASSESSMENT & PLAN:   This is a very pleasant 73 yr old spanish speaking male pt with chronic thrombocytopenia referred to hematology for the same.  Chronic Immune Thrombocytopenic Purpura (ITP) Chronic mild thrombocytopenia with stable platelet count of 133,000.  - Review of labs with no nutritional def, ANA neg, Hep panel neg, TSH normal -He is doing well with no symptoms. Given chronic thrombocytopenia and isolated without any other cytopenia, I recommended active observation. He is agreeable. - Monitor platelet count with CBC in 6 months. - If stable, perform annual CBC thereafter. - No treatment required for thrombocytopenia.  HISTORY OF PRESENTING ILLNESS:  Bryan Burnett 73 y.o. male is here because of thrombocytopenia.  Discussed the use of AI scribe software for clinical note transcription with the patient, who gave verbal consent to proceed.  History of Present Illness    Certified spanish interpreter used for the entirety of the conversation.  [name] is a 73 year old male who presents with thrombocytopenia. He was referred by his primary care physician for evaluation of low platelet count.  He has a history of chronic thrombocytopenia, with a platelet count consistently around 100-130 K for many years. This condition was initially identified through routine blood tests and is asymptomatic. Recent blood work on February 28th showed a platelet count of 133,000, consistent with previous results. Other blood parameters, including iron, B12, and folic acid levels, were normal. Tests for lupus and hepatitis were negative.  He inquires about the normal platelet count, which is clarified to be at least 150,000. He also questions whether taking antibiotics for infections, such  as ear infections, could affect his platelet count. It is noted that while certain antibiotics can lower platelet counts, his current thrombocytopenia is not likely attributed to antibiotic use, as his platelet levels have remained stable over the years.  No symptoms related to his thrombocytopenia.  All other systems were reviewed with the patient and are negative.  MEDICAL HISTORY:  Past Medical History:  Diagnosis Date   Cataract    BILATERAL   Hypertension     SURGICAL HISTORY: Past Surgical History:  Procedure Laterality Date   NONE  04/19/2019    SOCIAL HISTORY: Social History   Socioeconomic History   Marital status: Legally Separated    Spouse name: Not on file   Number of children: 7   Years of education: Not on file   Highest education level: Not on file  Occupational History   Not on file  Tobacco Use   Smoking status: Former   Smokeless tobacco: Never   Tobacco comments:    15 YEARS  Vaping Use   Vaping status: Never Used  Substance and Sexual Activity   Alcohol use: Never   Drug use: Never   Sexual activity: Not Currently  Other Topics Concern   Not on file  Social History Narrative   Lives with daughter and wife.  7 children.  10 grands.     Social Drivers of Corporate investment banker Strain: Not on file  Food Insecurity: Not on file  Transportation Needs: Not on file  Physical Activity: Not on file  Stress: Not on file  Social Connections: Unknown (12/01/2021)   Received from [facility]   Social Network    Social Network: Not on file  Intimate Partner Violence: Unknown (  10/23/2021)   Received from [facility]   HITS    Physically Hurt: Not on file    Insult or Talk Down To: Not on file    Threaten Physical Harm: Not on file    Scream or Curse: Not on file    FAMILY HISTORY: Family History  Problem Relation Age of Onset   Cancer Father    Colon cancer Neg Hx    Colon polyps Neg Hx    Esophageal cancer Neg Hx    Rectal cancer  Neg Hx    Stomach cancer Neg Hx     ALLERGIES:  has no known allergies.  MEDICATIONS:  Current Outpatient Medications  Medication Sig Dispense Refill   amLODipine (NORVASC) 10 MG tablet Take 10 mg by mouth daily.     atorvastatin (LIPITOR) 10 MG tablet Take 10 mg by mouth daily.     cetirizine (ZYRTEC) 10 MG tablet Take 10 mg by mouth as needed for allergies.     fluticasone (FLONASE) 50 MCG/ACT nasal spray Place 1 spray into both nostrils daily. 16 g 6   hydrochlorothiazide (HYDRODIURIL) 12.5 MG tablet Take 1 tablet (12.5 mg total) by mouth daily. 90 tablet 1   latanoprost (XALATAN) 0.005 % ophthalmic solution Place 1 drop into both eyes at bedtime. 7.5 mL 3   metFORMIN (GLUCOPHAGE-XR) 750 MG 24 hr tablet Take 1 tablet (750 mg total) by mouth daily. 30 tablet 3   solifenacin (VESICARE) 5 MG tablet Take 1 tablet (5 mg total) by mouth daily. 30 tablet 3   tamsulosin (FLOMAX) 0.4 MG CAPS capsule Take 1 capsule (0.4 mg total) by mouth daily. 90 capsule 3   No current facility-administered medications for this visit.     PHYSICAL EXAMINATION: ECOG PERFORMANCE STATUS: 0 - Asymptomatic  Vitals:   10/13/23 1550  BP: 135/69  Pulse: 90  Resp: 16  Temp: 98.1 F (36.7 C)  SpO2: 96%   Filed Weights   10/13/23 1550  Weight: 160 lb 14.4 oz (73 kg)    GENERAL:alert, no distress and comfortable  LABORATORY DATA:  I have reviewed the data as listed Lab Results  Component Value Date   WBC 5.7 09/17/2023   HGB 16.1 09/17/2023   HCT 48.4 09/17/2023   MCV 95.3 09/17/2023   PLT 133 (L) 09/17/2023     Chemistry      Component Value Date/Time   NA 140 09/17/2023 1058   NA 140 04/05/2019 1723   K 3.9 09/17/2023 1058   CL 105 09/17/2023 1058   CO2 31 09/17/2023 1058   BUN 19 09/17/2023 1058   BUN 16 04/05/2019 1723   CREATININE 0.93 09/17/2023 1058      Component Value Date/Time   CALCIUM 9.1 09/17/2023 1058   ALKPHOS 77 09/17/2023 1058   AST 29 09/17/2023 1058   ALT 27  09/17/2023 1058   BILITOT 0.7 09/17/2023 1058       RADIOGRAPHIC STUDIES: I have personally reviewed the radiological images as listed and agreed with the findings in the report. No results found.  All questions were answered. The patient knows to call the clinic with any problems, questions or concerns. I spent 20 minutes in the care of this patient including H and P, review of records, counseling and coordination of care.     Rachel Moulds, MD 10/13/2023 4:02 PM

## 2023-10-21 ENCOUNTER — Other Ambulatory Visit: Payer: Self-pay

## 2023-11-03 ENCOUNTER — Other Ambulatory Visit: Payer: Self-pay

## 2023-11-03 MED ORDER — AMLODIPINE BESYLATE 5 MG PO TABS
5.0000 mg | ORAL_TABLET | Freq: Every day | ORAL | 0 refills | Status: DC
  Filled 2023-11-03: qty 30, 30d supply, fill #0

## 2023-11-04 ENCOUNTER — Other Ambulatory Visit: Payer: Self-pay

## 2023-11-15 ENCOUNTER — Other Ambulatory Visit (HOSPITAL_COMMUNITY): Payer: Self-pay

## 2023-11-15 MED ORDER — SOLIFENACIN SUCCINATE 5 MG PO TABS
5.0000 mg | ORAL_TABLET | Freq: Every day | ORAL | 3 refills | Status: AC
  Filled 2023-11-15 – 2023-11-17 (×2): qty 30, 30d supply, fill #0
  Filled 2023-12-24: qty 30, 30d supply, fill #1
  Filled 2024-02-15: qty 30, 30d supply, fill #2
  Filled 2024-03-17: qty 30, 30d supply, fill #3
  Filled 2024-06-13: qty 30, 30d supply, fill #4
  Filled 2024-07-21: qty 30, 30d supply, fill #5
  Filled 2024-08-16: qty 30, 30d supply, fill #6

## 2023-11-15 MED ORDER — TAMSULOSIN HCL 0.4 MG PO CAPS
0.4000 mg | ORAL_CAPSULE | Freq: Every day | ORAL | 3 refills | Status: AC
  Filled 2023-11-15: qty 30, 15d supply, fill #0
  Filled 2023-11-17: qty 60, 30d supply, fill #0
  Filled 2023-12-24: qty 60, 30d supply, fill #1
  Filled 2024-03-17: qty 60, 30d supply, fill #2

## 2023-11-17 ENCOUNTER — Other Ambulatory Visit (HOSPITAL_COMMUNITY): Payer: Self-pay

## 2023-11-17 ENCOUNTER — Other Ambulatory Visit: Payer: Self-pay

## 2023-11-25 ENCOUNTER — Other Ambulatory Visit: Payer: Self-pay

## 2023-11-25 MED ORDER — ATORVASTATIN CALCIUM 20 MG PO TABS
20.0000 mg | ORAL_TABLET | Freq: Every day | ORAL | 2 refills | Status: AC
  Filled 2023-11-26: qty 30, 30d supply, fill #0
  Filled 2024-07-21: qty 30, 30d supply, fill #1

## 2023-11-25 MED ORDER — HYDROCHLOROTHIAZIDE 12.5 MG PO TABS
12.5000 mg | ORAL_TABLET | Freq: Every day | ORAL | 1 refills | Status: AC
  Filled 2023-12-03: qty 30, 30d supply, fill #0
  Filled 2024-07-21: qty 30, 30d supply, fill #1

## 2023-11-25 MED ORDER — ATORVASTATIN CALCIUM 10 MG PO TABS: 10.0000 mg | ORAL_TABLET | Freq: Every day | ORAL | 1 refills | Status: DC

## 2023-11-25 MED ORDER — AMLODIPINE BESYLATE 5 MG PO TABS
5.0000 mg | ORAL_TABLET | Freq: Every day | ORAL | 2 refills | Status: DC
Start: 2023-11-25 — End: 2024-05-16
  Filled 2023-12-03: qty 30, 30d supply, fill #0
  Filled 2024-02-15: qty 30, 30d supply, fill #1
  Filled 2024-03-29: qty 30, 30d supply, fill #2

## 2023-11-26 ENCOUNTER — Other Ambulatory Visit: Payer: Self-pay

## 2023-12-03 ENCOUNTER — Other Ambulatory Visit: Payer: Self-pay

## 2023-12-24 ENCOUNTER — Other Ambulatory Visit: Payer: Self-pay

## 2024-02-15 ENCOUNTER — Other Ambulatory Visit: Payer: Self-pay

## 2024-03-17 ENCOUNTER — Other Ambulatory Visit: Payer: Self-pay

## 2024-03-29 ENCOUNTER — Other Ambulatory Visit: Payer: Self-pay

## 2024-04-17 ENCOUNTER — Inpatient Hospital Stay: Attending: Hematology and Oncology | Admitting: Hematology and Oncology

## 2024-04-17 ENCOUNTER — Inpatient Hospital Stay

## 2024-04-17 ENCOUNTER — Other Ambulatory Visit: Payer: Self-pay

## 2024-04-18 ENCOUNTER — Other Ambulatory Visit: Payer: Self-pay

## 2024-05-16 ENCOUNTER — Other Ambulatory Visit: Payer: Self-pay

## 2024-05-16 MED ORDER — ATORVASTATIN CALCIUM 20 MG PO TABS
20.0000 mg | ORAL_TABLET | Freq: Every day | ORAL | 2 refills | Status: AC
  Filled 2024-05-16: qty 30, 30d supply, fill #0
  Filled 2024-06-13: qty 30, 30d supply, fill #1
  Filled 2024-08-16 (×2): qty 30, 30d supply, fill #2

## 2024-05-16 MED ORDER — HYDROCHLOROTHIAZIDE 12.5 MG PO TABS
12.5000 mg | ORAL_TABLET | Freq: Every day | ORAL | 1 refills | Status: AC
  Filled 2024-05-16: qty 30, 30d supply, fill #0
  Filled 2024-06-13: qty 30, 30d supply, fill #1
  Filled 2024-08-16: qty 30, 30d supply, fill #2

## 2024-05-16 MED ORDER — AMLODIPINE BESYLATE 5 MG PO TABS
5.0000 mg | ORAL_TABLET | Freq: Every day | ORAL | 1 refills | Status: AC
  Filled 2024-05-16: qty 30, 30d supply, fill #0
  Filled 2024-06-13: qty 30, 30d supply, fill #1
  Filled 2024-07-21: qty 30, 30d supply, fill #2
  Filled 2024-08-16: qty 30, 30d supply, fill #3

## 2024-05-17 ENCOUNTER — Other Ambulatory Visit: Payer: Self-pay

## 2024-06-01 ENCOUNTER — Other Ambulatory Visit: Payer: Self-pay

## 2024-06-02 ENCOUNTER — Other Ambulatory Visit: Payer: Self-pay

## 2024-06-02 MED ORDER — TAMSULOSIN HCL 0.4 MG PO CAPS
0.4000 mg | ORAL_CAPSULE | Freq: Every day | ORAL | 3 refills | Status: AC
  Filled 2024-06-02: qty 30, 30d supply, fill #0
  Filled 2024-07-10: qty 30, 30d supply, fill #1
  Filled 2024-08-16: qty 30, 30d supply, fill #2

## 2024-06-02 MED ORDER — TADALAFIL 5 MG PO TABS
5.0000 mg | ORAL_TABLET | Freq: Every day | ORAL | 3 refills | Status: AC
  Filled 2024-06-02: qty 30, 30d supply, fill #0
  Filled 2024-06-02: qty 90, 90d supply, fill #0
  Filled 2024-06-05: qty 30, 30d supply, fill #0
  Filled 2024-07-10: qty 30, 30d supply, fill #1
  Filled 2024-08-16: qty 30, 30d supply, fill #2

## 2024-06-02 MED ORDER — DOXYCYCLINE HYCLATE 100 MG PO CAPS
100.0000 mg | ORAL_CAPSULE | Freq: Two times a day (BID) | ORAL | 0 refills | Status: AC
  Filled 2024-06-02: qty 20, 10d supply, fill #0

## 2024-06-05 ENCOUNTER — Other Ambulatory Visit: Payer: Self-pay

## 2024-06-13 ENCOUNTER — Other Ambulatory Visit: Payer: Self-pay

## 2024-07-06 ENCOUNTER — Other Ambulatory Visit: Payer: Self-pay

## 2024-07-10 ENCOUNTER — Other Ambulatory Visit: Payer: Self-pay

## 2024-07-11 ENCOUNTER — Other Ambulatory Visit: Payer: Self-pay

## 2024-07-21 ENCOUNTER — Other Ambulatory Visit: Payer: Self-pay

## 2024-07-24 ENCOUNTER — Other Ambulatory Visit: Payer: Self-pay

## 2024-07-24 MED ORDER — LATANOPROST 0.005 % OP SOLN
1.0000 [drp] | Freq: Every day | OPHTHALMIC | 3 refills | Status: AC
  Filled 2024-07-24: qty 2.5, 25d supply, fill #0
  Filled 2024-08-16: qty 2.5, 25d supply, fill #1

## 2024-08-16 ENCOUNTER — Other Ambulatory Visit: Payer: Self-pay
